# Patient Record
Sex: Male | Born: 1985 | Race: White | Hispanic: No | Marital: Single | Smoking: Never smoker
Health system: Southern US, Community
[De-identification: ages and names within clinical notes are randomized; demographics above are authoritative.]

## PROBLEM LIST (undated history)

## (undated) DIAGNOSIS — I420 Dilated cardiomyopathy: Secondary | ICD-10-CM

## (undated) DIAGNOSIS — I10 Essential (primary) hypertension: Secondary | ICD-10-CM

## (undated) DIAGNOSIS — S83242A Other tear of medial meniscus, current injury, left knee, initial encounter: Secondary | ICD-10-CM

## (undated) DIAGNOSIS — S83519A Sprain of anterior cruciate ligament of unspecified knee, initial encounter: Secondary | ICD-10-CM

## (undated) DIAGNOSIS — I493 Ventricular premature depolarization: Secondary | ICD-10-CM

## (undated) DIAGNOSIS — S83282A Other tear of lateral meniscus, current injury, left knee, initial encounter: Secondary | ICD-10-CM

## (undated) HISTORY — DX: Dilated cardiomyopathy: I42.0

## (undated) HISTORY — DX: Sprain of anterior cruciate ligament of unspecified knee, initial encounter: S83.519A

## (undated) HISTORY — PX: WISDOM TOOTH EXTRACTION: SHX21

## (undated) HISTORY — DX: Ventricular premature depolarization: I49.3

## (undated) HISTORY — PX: OTHER SURGICAL HISTORY: SHX169

---

## 2011-07-19 ENCOUNTER — Encounter: Payer: Self-pay | Admitting: Physician Assistant

## 2011-07-20 ENCOUNTER — Encounter: Payer: Self-pay | Admitting: Physician Assistant

## 2011-07-20 ENCOUNTER — Ambulatory Visit (INDEPENDENT_AMBULATORY_CARE_PROVIDER_SITE_OTHER): Payer: BC Managed Care – PPO | Admitting: Physician Assistant

## 2011-07-20 DIAGNOSIS — I420 Dilated cardiomyopathy: Secondary | ICD-10-CM

## 2011-07-20 DIAGNOSIS — I428 Other cardiomyopathies: Secondary | ICD-10-CM

## 2011-07-20 DIAGNOSIS — I4949 Other premature depolarization: Secondary | ICD-10-CM

## 2011-07-20 DIAGNOSIS — I493 Ventricular premature depolarization: Secondary | ICD-10-CM

## 2011-07-20 MED ORDER — CARVEDILOL 3.125 MG PO TABS: 3.1250 mg | ORAL_TABLET | Freq: Two times a day (BID) | ORAL | Status: DC

## 2011-07-20 MED ORDER — LISINOPRIL 2.5 MG PO TABS: 2.5000 mg | ORAL_TABLET | Freq: Every day | ORAL | Status: DC

## 2011-07-20 NOTE — Progress Notes (Addendum)
History of Present Illness: Primary Electrophysiologist:  Dr. Sherryl Manges (new to Dr. Graciela Husbands today) PCP:  Dr. Royanne Foots  Kevin Simpson is a 25 y.o. male presents for evaluations of PVCs.  He has a h/o "irregular heartbeat" diagnosed at age 49.  He states he went to [facility] for evaluation and describes having an echocardiogram, event monitor and stress test.  He was told these were all normal.  He has not had to undergo regular follow up.  He has played high school basketball and even played for one year in college.  He has never had any problems with sports.  Denies any history of exercise induced syncope.  He now works in Manufacturing systems engineer.  He is active playing basketball on the weekends.  Denies chest pain, dyspnea, orthopnea, PND or edema.  Tore his ACL recently.  Was at surgical center today to have ACL repair.  Developed bradycardia and became near syncopal.  Dr. Yolanda Bonine (anesthesiologist) called Dr. Graciela Husbands, canceled surgery and got him added on to the schedule today.  Of note, saw PA for Dr. Virginia Rochester recently and had an echo due to PVCs.  Echo done at [facility] in [place] 10/3: EF 37%.  Past Medical History  Diagnosis Date  . Torn ACL   . DCM (dilated cardiomyopathy)     EF 37% by Echo 07/19/11   Medications: No current outpatient prescriptions on file.    Allergies: No Known Allergies  History  Substance Use Topics  . Smoking status: Never Smoker   . Smokeless tobacco: Never Used  . Alcohol Use: Yes     3 times per week     Family Hx:  No sudden cardiac death.  No CAD.    ROS:  Please see the history of present illness.  All other systems reviewed and negative.   Vital Signs: BP 118/82  Pulse 102  Ht 6\' 1"  (1.854 m)  Wt 183 lb (83.008 kg)  BMI 24.14 kg/m2  PHYSICAL EXAM: Well nourished, well developed, in no acute distress HEENT: normal Neck: no JVD Vascular: no carotid bruits Endocrine: no thyromegaly Cardiac:  normal S1, S2; irregular rhythm; no murmur Lungs:   clear to auscultation bilaterally, no wheezing, rhonchi or rales Abd: soft, nontender, no hepatomegaly Ext: no edema Skin: warm and dry Neuro:  CNs 2-12 intact, no focal abnormalities noted Psych: normal affect  EKG:  Sinus rhythm, HR 102, normal axis, frequent PVCs with RBBB pattern in V1 and positive axis in lead 3  ASSESSMENT AND PLAN:

## 2011-07-20 NOTE — Assessment & Plan Note (Signed)
For the most part, asymptomatic.  Episode today possibly vagal associated with functional bradycardia due to PVCs.  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  Patient also seen by Dr. Johney Frame.  Discussed further evaluation to include RFCA of PVCs.  Will proceed with Holter Monitor and Cardiac MRI.  Start medicines for CHF.  Risks and benefits of ablation explained to patient.  Dr. Johney Frame will see him back in 2 weeks to re-evaluate and decide on timing of ablation.

## 2011-07-20 NOTE — Patient Instructions (Addendum)
Your physician has requested that you have a cardiac MRI. Cardiac MRI uses a computer to create images of your heart as its beating, producing both still and moving pictures of your heart and major blood vessels. For further information please visit [email or web address]. Please follow the instruction sheet given to you today for more information.  Your physician has recommended that you wear a 24 hour holter monitor. Holter monitors are medical devices that record the heart's electrical activity. Doctors most often use these monitors to diagnose arrhythmias. Arrhythmias are problems with the speed or rhythm of the heartbeat. The monitor is a small, portable device. You can wear one while you do your normal daily activities. This is usually used to diagnose what is causing palpitations/syncope (passing out).  Your physician has recommended you make the following change in your medication:  Start Coreg and Lisinopril  Your physician recommends that you schedule a follow-up appointment in: 2 weeks with Dr. Johney Frame   October 17,2012 at 9:15am  You are scheduled for an ablation on 08/10/11 with  Dr. Johney Frame.  Tresa Endo RN will call you with your appointment time and instructions

## 2011-07-20 NOTE — Assessment & Plan Note (Addendum)
As above.  Start Lisinopril  2.5 mg QD and Coreg 3.25 mg BID.  Follow up as noted.

## 2011-07-24 ENCOUNTER — Encounter (INDEPENDENT_AMBULATORY_CARE_PROVIDER_SITE_OTHER): Payer: BC Managed Care – PPO

## 2011-07-24 DIAGNOSIS — R002 Palpitations: Secondary | ICD-10-CM

## 2011-07-26 ENCOUNTER — Encounter: Payer: Self-pay | Admitting: Internal Medicine

## 2011-08-02 ENCOUNTER — Ambulatory Visit (INDEPENDENT_AMBULATORY_CARE_PROVIDER_SITE_OTHER): Payer: BC Managed Care – PPO | Admitting: Internal Medicine

## 2011-08-02 ENCOUNTER — Encounter: Payer: Self-pay | Admitting: *Deleted

## 2011-08-02 ENCOUNTER — Encounter: Payer: Self-pay | Admitting: Internal Medicine

## 2011-08-02 VITALS — BP 132/83 | HR 61 | Ht 73.0 in | Wt 188.0 lb

## 2011-08-02 DIAGNOSIS — I4949 Other premature depolarization: Secondary | ICD-10-CM

## 2011-08-02 DIAGNOSIS — I420 Dilated cardiomyopathy: Secondary | ICD-10-CM

## 2011-08-02 DIAGNOSIS — I493 Ventricular premature depolarization: Secondary | ICD-10-CM

## 2011-08-02 DIAGNOSIS — I428 Other cardiomyopathies: Secondary | ICD-10-CM

## 2011-08-02 NOTE — Assessment & Plan Note (Signed)
Likely tachycardia mediated Continue coreg and lisinopril We will proceed with ablation and then reassess his EF several months post ablation.

## 2011-08-02 NOTE — Patient Instructions (Signed)
Your physician recommends that you schedule a follow-up appointment in: 4 weeks after ablation which is scheduled for 08/10/11

## 2011-08-02 NOTE — Progress Notes (Signed)
The patient presents today for routine electrophysiology followup.  Since last being seen in our clinic, the patient reports doing very well.  His primary concern is with his knee pain and desire to have surgery for this.  (He recently tore his ACL).    Today, he denies symptoms of palpitations, chest pain, shortness of breath, orthopnea, PND, lower extremity edema, dizziness, presyncope, syncope, or neurologic sequela. His recent holter monitor documented 46,241 PVCs (37%).  The patient feels that he is tolerating medications without difficulties and is otherwise without complaint today.   Past Medical History  Diagnosis Date  . Torn ACL   . DCM (dilated cardiomyopathy)     EF 37% by Echo 07/19/11  . PVC's (premature ventricular contractions)    No past surgical history on file.  Current Outpatient Prescriptions  Medication Sig Dispense Refill  . carvedilol (COREG) 3.125 MG tablet Take 1 tablet (3.125 mg total) by mouth 2 (two) times daily with a meal.  60 tablet  6  . lisinopril (PRINIVIL,ZESTRIL) 2.5 MG tablet Take 1 tablet (2.5 mg total) by mouth daily.  30 tablet  6    No Known Allergies  History   Social History  . Marital Status: Single    Spouse Name: N/A    Number of Children: N/A  . Years of Education: N/A   Occupational History  . furniture delivery    Social History Main Topics  . Smoking status: Never Smoker   . Smokeless tobacco: Never Used  . Alcohol Use: Yes     3 times per week  . Drug Use: Yes     marijuana  . Sexually Active: Not on file   Other Topics Concern  . Not on file   Social History Narrative  . No narrative on file    Physical Exam: Filed Vitals:   08/02/11 0959  BP: 132/83  Pulse: 61  Height: 6\' 1"  (1.854 m)  Weight: 188 lb (85.276 kg)    GEN- The patient is well appearing, alert and oriented x 3 today.   Head- normocephalic, atraumatic Eyes-  Sclera clear, conjunctiva pink Ears- hearing intact Oropharynx- clear Neck- supple, no  JVP Lymph- no cervical lymphadenopathy Lungs- Clear to ausculation bilaterally, normal work of breathing Heart- Regular rate and rhythm with frequently ectopy, no murmurs, rubs or gallops, PMI not laterally displaced GI- soft, NT, ND, + BS Extremities- no clubbing, cyanosis, or edema MS-walks slowly due to torn acl Skin- no rash or lesion Psych- euthymic mood, full affect Neuro- strength and sensation are intact  EKG 07/20/11 reveals sinus with PVCs of a RBB/R inferior axis morphology  Assessment and Plan:

## 2011-08-02 NOTE — Assessment & Plan Note (Signed)
The patient has a very high burden of PVCs.  Though he is not symptomatic, I feel that his dilated cardiomyopathy is tachycardia mediated and due to his PVCs.  I have therefore recommended catheter ablation.  Therapeutic strategies for PVCs including medicine and ablation were discussed in detail with the patient today. Risk, benefits, and alternatives to EP study and radiofrequency ablation  were also discussed in detail today. These risks include but are not limited to stroke, bleeding, vascular damage, tamponade, perforation, damage to the heart and other structures,AV block requiring a pacemaker, worsening renal function, and death. The patient understands these risk and wishes to proceed.  We will therefore proceed with catheter ablation at the next available time.  We will obtain a cardiac MRI in there interim to evaluate for LV scar.

## 2011-08-03 ENCOUNTER — Encounter: Payer: Self-pay | Admitting: *Deleted

## 2011-08-04 ENCOUNTER — Telehealth: Payer: Self-pay | Admitting: *Deleted

## 2011-08-04 ENCOUNTER — Ambulatory Visit (HOSPITAL_COMMUNITY)
Admission: RE | Admit: 2011-08-04 | Discharge: 2011-08-04 | Disposition: A | Discharge status: Home / Self Care | Payer: BC Managed Care – PPO | Source: Ambulatory Visit | Attending: Physician Assistant | Admitting: Physician Assistant

## 2011-08-04 DIAGNOSIS — I4949 Other premature depolarization: Secondary | ICD-10-CM

## 2011-08-04 DIAGNOSIS — I428 Other cardiomyopathies: Secondary | ICD-10-CM | POA: Insufficient documentation

## 2011-08-04 DIAGNOSIS — I493 Ventricular premature depolarization: Secondary | ICD-10-CM

## 2011-08-04 DIAGNOSIS — I420 Dilated cardiomyopathy: Secondary | ICD-10-CM

## 2011-08-04 LAB — BASIC METABOLIC PANEL
BUN: 15 mg/dL (ref 6–23)
Creatinine, Ser: 1.15 mg/dL (ref 0.50–1.35)
GFR calc Af Amer: >90 mL/min (ref >90)
GFR calc non Af Amer: 89 mL/min — ABNORMAL LOW (ref >90)
Glucose, Bld: 97 mg/dL (ref 70–99)

## 2011-08-04 MED ORDER — GADOBENATE DIMEGLUMINE 529 MG/ML IV SOLN
25.0000 mL | Freq: Once | INTRAVENOUS | Status: AC
  Administered 2011-08-04: 25 mL via INTRAVENOUS

## 2011-08-04 NOTE — Telephone Encounter (Signed)
Pr aware of lab results today and will have repeat labs 08/07/11. Lab order placed today bmet, pth. When giving pt his lab results he states that he did have a big glass of orange juice and a bagel w/cream cheese. I told him that I would let the PA know this since it may have a reflection on his lab results from today. Told pt that I will call him back if he does not need to repeat labs on 08/07/11 (monday). Pt gave verbal understanding today. Danielle Rankin

## 2011-08-07 ENCOUNTER — Other Ambulatory Visit (INDEPENDENT_AMBULATORY_CARE_PROVIDER_SITE_OTHER): Payer: BC Managed Care – PPO | Admitting: *Deleted

## 2011-08-07 DIAGNOSIS — I4949 Other premature depolarization: Secondary | ICD-10-CM

## 2011-08-07 DIAGNOSIS — I428 Other cardiomyopathies: Secondary | ICD-10-CM

## 2011-08-07 LAB — BASIC METABOLIC PANEL
BUN: 14 mg/dL (ref 6–23)
Calcium: 9.9 mg/dL (ref 8.4–10.5)
Creatinine, Ser: 1.3 mg/dL (ref 0.4–1.5)

## 2011-08-08 ENCOUNTER — Other Ambulatory Visit: Payer: Self-pay | Admitting: Physician Assistant

## 2011-08-08 LAB — CBC
HCT: 48.1 % (ref 39.0–52.0)
Hemoglobin: 16.1 g/dL (ref 13.0–17.0)
RBC: 5.33 MIL/uL (ref 4.22–5.81)
WBC: 4.4 10*3/uL (ref 4.0–10.5)

## 2011-08-08 LAB — PTH, INTACT AND CALCIUM

## 2011-08-10 ENCOUNTER — Ambulatory Visit (HOSPITAL_COMMUNITY)
Admission: RE | Admit: 2011-08-10 | Discharge: 2011-08-11 | Disposition: A | Discharge status: Home / Self Care | Payer: BC Managed Care – PPO | Source: Ambulatory Visit | Attending: Internal Medicine | Admitting: Internal Medicine

## 2011-08-10 DIAGNOSIS — I4729 Other ventricular tachycardia: Secondary | ICD-10-CM | POA: Insufficient documentation

## 2011-08-10 DIAGNOSIS — I472 Ventricular tachycardia, unspecified: Secondary | ICD-10-CM | POA: Insufficient documentation

## 2011-08-10 DIAGNOSIS — I4949 Other premature depolarization: Secondary | ICD-10-CM | POA: Insufficient documentation

## 2011-08-10 LAB — POCT ACTIVATED CLOTTING TIME
Activated Clotting Time: 204 seconds
Activated Clotting Time: 210 seconds
Activated Clotting Time: 259 seconds

## 2011-08-10 LAB — PTH, INTACT AND CALCIUM: PTH: 16.9 pg/mL (ref 14.0–72.0)

## 2011-08-11 ENCOUNTER — Telehealth: Payer: Self-pay | Admitting: *Deleted

## 2011-08-11 NOTE — Telephone Encounter (Signed)
lmom labs normal. Danielle Rankin

## 2011-08-15 ENCOUNTER — Encounter: Payer: Self-pay | Admitting: Physician Assistant

## 2011-08-17 NOTE — Op Note (Signed)
NAMEMarland Kitchen  Kevin Simpson, Kevin Simpson NO.:  [id number]  MEDICAL RECORD NO.:  [id number]  LOCATION:  2504                         FACILITY:  [facility]  PHYSICIAN:  [name], MD       DATE OF BIRTH:  [birth year]  DATE OF PROCEDURE: DATE OF DISCHARGE:                              OPERATIVE REPORT   SURGEON:  [name], MD  PREPROCEDURE DIAGNOSIS:  Premature ventricular contractions and nonsustained ventricular tachycardia.  POSTPROCEDURE DIAGNOSIS:  Nonsustained ventricular tachycardia and premature ventricular contractions arising from the anterolateral papillary muscle of the left ventricle.  PROCEDURES: 1. Comprehensive electrophysiologic study. 2. Coronary sinus pacing and recording. 3. Three-D mapping of ventricular tachycardia. 4. Ablation of ventricular tachycardia. 5. Arterial blood pressure monitoring. 6. Left ventricular pacing and recording.  INTRODUCTION:  Kevin Simpson is a pleasant 25 year old gentleman who presents for EP study and radiofrequency ablation.  He recently has been found to have very frequent PVCs and nonsustained ventricular tachycardia.  He has a cardiomyopathy which is felt to likely be PVC mediated with a recent ejection fraction of 37%.  He therefore presents today for EP study and radiofrequency ablation.  DESCRIPTION OF PROCEDURE:  Informed written consent was obtained, and the patient was brought to the electrophysiology lab in the fasting state.  He was adequately sedated with intravenous medications as outlined in the anesthesia report.  The patient's right groin was prepped and draped in the usual sterile fashion by the EP lab staff. Using a percutaneous Seldinger technique, two 7-French and one 8-French hemostasis sheaths were placed in the right common femoral vein.  An 8- [place] hemostasis sheath was placed in the right common femoral artery for blood pressure monitoring.  A 7-French Biosense Webster decapolar coronary sinus  catheter was introduced through the right common femoral vein and advanced into the coronary sinus for recording and pacing from this location.  A 6-French quadripolar Josephson catheter was introduced through the right common femoral vein and advanced into the right ventricle for recording and pacing.  This catheter was then pulled back to the His bundle location.  The patient presented to the electrophysiology lab in sinus rhythm with PVCs in a bigeminal pattern. The PVC morphology was of a right bundle-branch right inferior axis with a QRS duration of 156 msec.  The PR interval measured 139 msec with a QRS duration on sinus beats of 110 msec and a QT interval of 280 msec. The AH interval measured 104 msec with an HV interval of 45 msec. Atrial or ventricular pacing could not be easily performed due to incessant tachycardia.  I therefore elected to perform mapping and ablation of the PVC focus.  A 3.5-mm Biosense Webster EZ Steer ThermoCool ablation catheter was introduced into the right common femoral artery and advanced into the left ventricle using a retrograde aortic approach.  Heparin was administered for adequate anticoagulation. Three-dimensional electroanatomical mapping was performed using the CARTO mapping system.  This demonstrated that the earliest activation arose over the mid anterolateral portion of the left ventricle.  This was felt to be in close approximation to the left anterolateral papillary muscle.  At the site of earliest activation, the local activation preceded the surface QRS  by 40 msec.  Pace mapping was performed which revealed a 12/12 pace map match.  A series of radiofrequency applications were delivered in this location at 40 watts with a target temperature of 40 degrees.  Applications were delivered between 60 and 120 seconds in duration.  A total of 19 radiofrequency applications were delivered.  During ablation, initially, the PVCs accelerated into  sustained ventricular tachycardia with the same surface QRS morphology and then terminated.  Prolonged periods of sinus rhythm were observed, however, eventually the PVCs would return.  In this fashion extensive radiofrequency ablation was delivered in this local area.  Additional mapping again confirmed that this was earliest activation site.  During sinus rhythm, ventricular pacing was performed, which revealed VA dissociation.  Atrial pacing was performed, which revealed PR less than RR with an AV Wenckebach cycle length of 400 msec. The patient was observed to continue to have intermittent PVCs.  It was felt that this was likely a part of the mitral valve apparatus over the anterolateral papillary muscle of the left ventricle, which was a very thick tissue.  The patient continued to have frequent but significantly reduced premature ventricular contractions at the end of the case after an extensive ablation.  It was felt most prudent to discontinue the procedure.  The procedure was therefore considered completed.  All catheters were removed and the sheaths were aspirated and flushed.  The sheaths were removed and hemostasis was assured.  There were no early apparent complications.  CONCLUSIONS: 1. Sinus rhythm with frequent premature ventricular contractions upon     presentation.  These PVCs were of a right bundle-branch right     inferior axis and were felt to arise from the anterolateral     papillary muscle apparatus. 2. Significant radiofrequency current was applied with significant     reduction in PVC frequency, however, the PVC was not fully     terminated despite extensive ablation. 3. No early apparent complications.     [name], MD     JA/MEDQ  D:  08/10/2011  T:  08/11/2011  Job:  [id number]  cc:   Duke Salvia, MD, [facility] Royanne Foots, MD  Electronically Signed by [name] MD on 08/17/2011 02:35:31 PM

## 2011-08-21 ENCOUNTER — Ambulatory Visit: Payer: BC Managed Care – PPO | Admitting: Internal Medicine

## 2011-09-06 ENCOUNTER — Encounter: Payer: Self-pay | Admitting: Internal Medicine

## 2011-09-06 ENCOUNTER — Ambulatory Visit (INDEPENDENT_AMBULATORY_CARE_PROVIDER_SITE_OTHER): Payer: BC Managed Care – PPO | Admitting: Internal Medicine

## 2011-09-06 VITALS — BP 145/61 | HR 41 | Ht 73.0 in | Wt 193.0 lb

## 2011-09-06 DIAGNOSIS — I4949 Other premature depolarization: Secondary | ICD-10-CM

## 2011-09-06 DIAGNOSIS — I493 Ventricular premature depolarization: Secondary | ICD-10-CM

## 2011-09-06 DIAGNOSIS — I428 Other cardiomyopathies: Secondary | ICD-10-CM

## 2011-09-06 DIAGNOSIS — I420 Dilated cardiomyopathy: Secondary | ICD-10-CM

## 2011-09-06 MED ORDER — LISINOPRIL 5 MG PO TABS: 5.0000 mg | ORAL_TABLET | Freq: Every day | ORAL | Status: DC

## 2011-09-06 MED ORDER — CARVEDILOL 6.25 MG PO TABS: 6.2500 mg | ORAL_TABLET | Freq: Two times a day (BID) | ORAL | Status: DC

## 2011-09-06 NOTE — Assessment & Plan Note (Addendum)
Unfortunately, his PVCs persist s/p ablation.  At this time, I think that our option is to consider an antiarrhythmic drug verses consideration of ablation at a tertiary care center.  Given the complexity of this patient's case, I would favor referral to Dr Humberto Leep at [facility].    I have spoke with Dr Staci Righter who is willing to see the patient in consultation with plans for subsequent repeat ablation.  Will forward my notes to his office and await his reply. For now continue current medical therapy.  Pt OK to proceed with knee surgery in the interim.

## 2011-09-06 NOTE — Assessment & Plan Note (Signed)
Increase lisinopril to 5mg  daily Continue coreg His cardiomyopathy is likely due to PVCs and should hopefully resolve if we can manage his PVCs.

## 2011-09-06 NOTE — Progress Notes (Signed)
Kevin Maker, MD PCP  The patient presents today for electrophysiology followup.  Since his recent ablation, the patient reports doing very well.  He denies procedure related complications.  He remains minimally symptomatic with his PVCs.  He reports mild dizziness and fatigue with coreg.  Today, he denies symptoms of palpitations, chest pain, shortness of breath, orthopnea, PND, lower extremity edema,  presyncope, syncope, or neurologic sequela.  The patient feels that he is tolerating medications without difficulties and is otherwise without complaint today.   Past Medical History  Diagnosis Date  . Torn ACL   . DCM (dilated cardiomyopathy)     EF 37% by Echo 07/19/11  . PVC's (premature ventricular contractions)     s/p EPS and ablation 08/10/11 with PVC unsuccesfully ablated   Past Surgical History  Procedure Date  . Ep study and radiofrequency ablation 08/10/11    PVC ablation,  The PVCs were of a right bundle-branch right inferior axis and were felt to arise from the anterolateral  papillary muscle but could not be successfully ablated    Current Outpatient Prescriptions  Medication Sig Dispense Refill  . carvedilol (COREG) 6.25 MG tablet Take 1 tablet (6.25 mg total) by mouth 2 (two) times daily with a meal.  60 tablet  6  . lisinopril (PRINIVIL,ZESTRIL) 5 MG tablet Take 1 tablet (5 mg total) by mouth daily.  30 tablet  6  . DISCONTD: carvedilol (COREG) 3.125 MG tablet Take 1 tablet (3.125 mg total) by mouth 2 (two) times daily with a meal.  60 tablet  6  . DISCONTD: lisinopril (PRINIVIL,ZESTRIL) 2.5 MG tablet Take 1 tablet (2.5 mg total) by mouth daily.  30 tablet  6    No Known Allergies  History   Social History  . Marital Status: Single    Spouse Name: N/A    Number of Children: N/A  . Years of Education: N/A   Occupational History  . furniture delivery    Social History Main Topics  . Smoking status: Never Smoker   . Smokeless tobacco: Never Used  . Alcohol Use:  Yes     3 times per week  . Drug Use: Yes     marijuana  . Sexually Active: Not on file   Other Topics Concern  . Not on file   Social History Narrative  . No narrative on file   Physical Exam: Filed Vitals:   09/06/11 1039  BP: 145/61  Pulse: 41  Height: 6\' 1"  (1.854 m)  Weight: 193 lb (87.544 kg)    GEN- The patient is well appearing, alert and oriented x 3 today.   Head- normocephalic, atraumatic Eyes-  Sclera clear, conjunctiva pink Ears- hearing intact Oropharynx- clear Neck- supple, no JVP Lymph- no cervical lymphadenopathy Lungs- Clear to ausculation bilaterally, normal work of breathing Heart- Regularly irregular rhythm with bigeminal ectopy, no murmurs, rubs or gallops, PMI not laterally displaced GI- soft, NT, ND, + BS Extremities- no clubbing, cyanosis, or edema MS- no significant deformity or atrophy Skin- no rash or lesion Psych- euthymic mood, full affect Neuro- strength and sensation are intact  ekg today reveals sinus rhythm with PVCs in bigeminy,  PVCs are RBB R inferior axis  Assessment and Plan:

## 2011-09-06 NOTE — Patient Instructions (Addendum)
Your physician recommends that you schedule a follow-up appointment in: 6 weeks with Dr Johney Frame  Dr Johney Frame will be in touch with Dr Maryann Conners regarding your case and we will contact you next week  Your physician has recommended you make the following change in your medication:  1)Increase Lisinopril to 5mg  daily

## 2011-09-11 ENCOUNTER — Telehealth: Payer: Self-pay | Admitting: Internal Medicine

## 2011-09-11 NOTE — Telephone Encounter (Signed)
LOVx2 ([facility]) 12 lead,Holer Monitor, Cardiac MRI,EP Procedure all faxed to Dr.Wilber's Office/[place]  @ [phone]  09/11/11/km

## 2011-09-13 ENCOUNTER — Encounter: Payer: Self-pay | Admitting: Physician Assistant

## 2011-09-15 ENCOUNTER — Telehealth: Payer: Self-pay | Admitting: Internal Medicine

## 2011-09-15 NOTE — Telephone Encounter (Signed)
Kevin Simpson is needing an acl repair and they are requesting a clearance be faxed.  Dr Cynda Familia is doing the surgery.  The date will not be set until the clearance is faxed.  They would like to do the surgery in Dec. If possible.

## 2011-09-15 NOTE — Telephone Encounter (Signed)
Pt having a knee surgery, needs clearance letter to [place] doing surgery, fax to [facility] dr landau [phone] also was to check with a specialist in [place] about abaltion

## 2011-09-19 ENCOUNTER — Telehealth: Payer: Self-pay | Admitting: Internal Medicine

## 2011-09-19 NOTE — Telephone Encounter (Signed)
Last dictation sent for clearance

## 2011-09-19 NOTE — Telephone Encounter (Signed)
New problem Pt wants to know about surgical clearance letter to Dr Dion Saucier please call him back

## 2011-09-19 NOTE — Telephone Encounter (Signed)
Clearance faxed   Father aware

## 2011-09-21 ENCOUNTER — Telehealth: Payer: Self-pay | Admitting: Internal Medicine

## 2011-09-21 NOTE — Telephone Encounter (Signed)
New problem:  Status of clearance letter to [facility]/ [facility].

## 2011-09-21 NOTE — Telephone Encounter (Signed)
Spoke with patients father  This was faxed on 09/19/11  It is part of Dr Jenel Lucks dictation from his 09/06/11 office note  I will call there office for him and make them aware of where to look

## 2011-10-23 ENCOUNTER — Ambulatory Visit (INDEPENDENT_AMBULATORY_CARE_PROVIDER_SITE_OTHER): Payer: BC Managed Care – PPO | Admitting: Internal Medicine

## 2011-10-23 ENCOUNTER — Other Ambulatory Visit: Payer: Self-pay | Admitting: Orthopedic Surgery

## 2011-10-23 ENCOUNTER — Encounter: Payer: Self-pay | Admitting: Internal Medicine

## 2011-10-23 DIAGNOSIS — I4949 Other premature depolarization: Secondary | ICD-10-CM

## 2011-10-23 DIAGNOSIS — I428 Other cardiomyopathies: Secondary | ICD-10-CM

## 2011-10-23 DIAGNOSIS — I420 Dilated cardiomyopathy: Secondary | ICD-10-CM

## 2011-10-23 DIAGNOSIS — I493 Ventricular premature depolarization: Secondary | ICD-10-CM

## 2011-10-23 MED ORDER — LISINOPRIL 10 MG PO TABS: 10.0000 mg | ORAL_TABLET | Freq: Every day | ORAL | Status: DC

## 2011-10-23 NOTE — Progress Notes (Signed)
Dan Maker, MD PCP  The patient presents today for electrophysiology followup.  Since his last visit, the patient reports doing very well.  He remains minimally symptomatic with his PVCs.  He is having knee surgery next week and will then plan to see Stephenie Acres MD in 2-3 months for repeat ablation.  Today, he denies symptoms of palpitations, chest pain, shortness of breath, orthopnea, PND, lower extremity edema,  presyncope, syncope, or neurologic sequela.  The patient feels that he is tolerating medications without difficulties and is otherwise without complaint today.   Past Medical History  Diagnosis Date  . Torn ACL   . DCM (dilated cardiomyopathy)     EF 37% by Echo 07/19/11  . PVC's (premature ventricular contractions)     s/p EPS and ablation 08/10/11 with PVC unsuccesfully ablated   Past Surgical History  Procedure Date  . Ep study and radiofrequency ablation 08/10/11    PVC ablation,  The PVCs were of a right bundle-branch right inferior axis and were felt to arise from the anterolateral  papillary muscle but could not be successfully ablated    Current Outpatient Prescriptions  Medication Sig Dispense Refill  . ASPIRIN CR PO Take 1 tablet by mouth daily.        . carvedilol (COREG) 6.25 MG tablet Take 1 tablet (6.25 mg total) by mouth 2 (two) times daily with a meal.  60 tablet  6  . lisinopril (PRINIVIL,ZESTRIL) 5 MG tablet Take 1 tablet (5 mg total) by mouth daily.  30 tablet  6    No Known Allergies  History   Social History  . Marital Status: Single    Spouse Name: N/A    Number of Children: N/A  . Years of Education: N/A   Occupational History  . furniture delivery    Social History Main Topics  . Smoking status: Never Smoker   . Smokeless tobacco: Never Used  . Alcohol Use: Yes     3 times per week  . Drug Use: Yes     marijuana  . Sexually Active: Not on file   Other Topics Concern  . Not on file   Social History Narrative  . No narrative on  file   Physical Exam: Filed Vitals:   10/23/11 0931  BP: 130/80  Pulse: 36  Height: 6\' 1"  (1.854 m)  Weight: 194 lb (87.998 kg)    GEN- The patient is well appearing, alert and oriented x 3 today.   Head- normocephalic, atraumatic Eyes-  Sclera clear, conjunctiva pink Ears- hearing intact Oropharynx- clear Neck- supple, no JVP Lymph- no cervical lymphadenopathy Lungs- Clear to ausculation bilaterally, normal work of breathing Heart- Regularly irregular rhythm with bigeminal ectopy, no murmurs, rubs or gallops, PMI not laterally displaced GI- soft, NT, ND, + BS Extremities- no clubbing, cyanosis, or edema MS- no significant deformity or atrophy Skin- no rash or lesion Psych- euthymic mood, full affect Neuro- strength and sensation are intact  ekg last visit revealed sinus rhythm with PVCs in bigeminy,  PVCs are RBB R inferior axis  Assessment and Plan:

## 2011-10-23 NOTE — Patient Instructions (Signed)
Your physician recommends that you schedule a follow-up appointment in: 3 months with Dr Johney Frame    Your physician has recommended you make the following change in your medication:  1) Increase Lisinopril to 10mg  daily

## 2011-10-23 NOTE — Assessment & Plan Note (Signed)
No changes today Will see Dr Lenna Gilford [facility]) in 2-3 months to consider catheter ablation of difficult to treat PVCs

## 2011-10-23 NOTE — Assessment & Plan Note (Signed)
Increase lisinopril to 10mg  daily today

## 2011-10-24 ENCOUNTER — Encounter (HOSPITAL_COMMUNITY): Payer: Self-pay | Admitting: Pharmacy Technician

## 2011-10-24 ENCOUNTER — Ambulatory Visit (HOSPITAL_COMMUNITY)
Admission: RE | Admit: 2011-10-24 | Discharge: 2011-10-24 | Disposition: A | Discharge status: Home / Self Care | Payer: BC Managed Care – PPO | Source: Ambulatory Visit | Attending: Anesthesiology | Admitting: Anesthesiology

## 2011-10-24 ENCOUNTER — Other Ambulatory Visit (HOSPITAL_COMMUNITY): Payer: Self-pay | Admitting: *Deleted

## 2011-10-24 ENCOUNTER — Encounter (HOSPITAL_COMMUNITY)
Admission: RE | Admit: 2011-10-24 | Discharge: 2011-10-24 | Disposition: A | Discharge status: Home / Self Care | Payer: BC Managed Care – PPO | Source: Ambulatory Visit | Attending: Orthopedic Surgery | Admitting: Orthopedic Surgery

## 2011-10-24 ENCOUNTER — Encounter (HOSPITAL_COMMUNITY): Payer: Self-pay

## 2011-10-24 DIAGNOSIS — Z01812 Encounter for preprocedural laboratory examination: Secondary | ICD-10-CM | POA: Insufficient documentation

## 2011-10-24 DIAGNOSIS — Z01818 Encounter for other preprocedural examination: Secondary | ICD-10-CM | POA: Insufficient documentation

## 2011-10-24 HISTORY — DX: Essential (primary) hypertension: I10

## 2011-10-24 LAB — BASIC METABOLIC PANEL
BUN: 14 mg/dL (ref 6–23)
CO2: 27 mEq/L (ref 19–32)
Chloride: 101 mEq/L (ref 96–112)
Creatinine, Ser: 1.36 mg/dL — ABNORMAL HIGH (ref 0.50–1.35)
GFR calc Af Amer: 83 mL/min — ABNORMAL LOW (ref >90)
Potassium: 4.3 mEq/L (ref 3.5–5.1)

## 2011-10-24 LAB — CBC
HCT: 47 % (ref 39.0–52.0)
Hemoglobin: 16.5 g/dL (ref 13.0–17.0)
MCH: 31.2 pg (ref 26.0–34.0)
MCHC: 35.1 g/dL (ref 30.0–36.0)
RBC: 5.29 MIL/uL (ref 4.22–5.81)

## 2011-10-24 LAB — SURGICAL PCR SCREEN: Staphylococcus aureus: POSITIVE — AB

## 2011-10-24 NOTE — Pre-Procedure Instructions (Signed)
21 Kevin Simpson  10/24/2011   Your procedure is scheduled on 10/30/2011 @ 1215  Report to [facility] at  10:15 AM.  Call this number if you have problems the morning of surgery: [phone]    Remember:   Do not eat food:After Midnight.  May have clear liquids: up to 4 Hours before arrival.  Clear liquids include soda, tea, black coffee, apple or grape juice, broth.until 6:15  AM  Take these medicines the morning of surgery with A SIP OF WATER Coreg  Do not wear jewelry, make-up or nail polish.  Do not wear lotions, powders, or perfumes. You may wear deodorant.  Do not shave 48 hours prior to surgery.  Do not bring valuables to the hospital.  Contacts, dentures or bridgework may not be worn into surgery.  Leave suitcase in the car. After surgery it may be brought to your room.  For patients admitted to the hospital, checkout time is 11:00 AM the day of discharge.   Patients discharged the day of surgery will not be allowed to drive home.  Name and phone number of your driver:  Special Instructions: Incentive Spirometry - Practice and bring it with you on the day of surgery. and CHG Shower Use Special Wash: 1/2 bottle night before surgery and 1/2 bottle morning of surgery.   Please read over the following fact sheets that you were given: MRSA Information and Surgical Site Infection Prevention

## 2011-10-24 NOTE — Consult Note (Signed)
Anesthesia:  Patient is a 26 year old male scheduled for an ACL reconstruction on 10/30/11.  His other history includes dilated CM with EF 37%, PVCs s/p unsuccessful ablation on 08/10/11, HTN.  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  His EP Cardiologist is Dr. Johney Simpson.  He was cleared for this procedure (see 09/07/11 Note).  He was actually just seen again by Dr. Johney Simpson on 10/23/11 and no changes were made other than increasing his Lisinopril.   He is also on Coreg and ASA.  Kevin Simpson is also planning for catheter ablation by Dr. Stephenie Acres [facility]) post-operatively in 2-3 months.  His last EKG was on 09/06/11 and showed ST at 106 bpm, bigeminy PVCs, cannot rule out inferior infarct, and lateral ST abnormality.  He has known frequent PVCs and is yet to have a repeat ablation, so I will not plan on repeating his EKG pre-operatively.  Echo done at [place] [facility] on 07/19/11 showed a dilated LV with diffuse hypokinesis, moderately depressed EF, EF 37%, PVCs, and no significant valve disease, unable to estimate pulmonary artery pressure.  CXR today showed no acute cardiopulmonary process.  Labs are acceptable.  His Cr is mildly elevated at 1.36.    Plan to proceed.  His assigned Anesthesiologist will evaluate him on the day of surgery.  Can consider placing defibrillator pads perioperatively due to his history of dilated CM, EF 37%, and frequent PVCs.

## 2011-10-24 NOTE — Anesthesia Preprocedure Evaluation (Addendum)
Anesthesia Evaluation  Patient identified by MRN, date of birth, ID band Patient awake    Reviewed: Allergy & Precautions, H&P , NPO status , Patient's Chart, lab work & pertinent test results, reviewed documented beta blocker date and time   Airway Mallampati: I TM Distance: >3 FB Neck ROM: Full    Dental  (+) Teeth Intact   Pulmonary          Cardiovascular hypertension, Pt. on home beta blockers  Dilated CM, EF 37%, frequent/bigeminy PVCs s/p failed ablation (Dr. Johney Frame)   Neuro/Psych    GI/Hepatic   Endo/Other    Renal/GU      Musculoskeletal   Abdominal   Peds  Hematology   Anesthesia Other Findings   Reproductive/Obstetrics                         Anesthesia Physical Anesthesia Plan  ASA: III  Anesthesia Plan: General   Post-op Pain Management: MAC Combined w/ Regional for Post-op pain   Induction: Intravenous  Airway Management Planned: LMA  Additional Equipment:   Intra-op Plan:   Post-operative Plan: Extubation in OR  Informed Consent: I have reviewed the patients History and Physical, chart, labs and discussed the procedure including the risks, benefits and alternatives for the proposed anesthesia with the patient or authorized representative who has indicated his/her understanding and acceptance.     Plan Discussed with: CRNA and Surgeon  Anesthesia Plan Comments:         Anesthesia Quick Evaluation

## 2011-10-27 NOTE — Progress Notes (Signed)
Spoke with patient's dad and instructed him to arrive at 1130 on Monday due to surgery time change.

## 2011-10-29 MED ORDER — CEFAZOLIN SODIUM-DEXTROSE 2-3 GM-% IV SOLR
2.0000 g | INTRAVENOUS | Status: AC
  Administered 2011-10-30: 2 g via INTRAVENOUS
  Filled 2011-10-29: qty 50

## 2011-10-30 ENCOUNTER — Encounter (HOSPITAL_COMMUNITY): Payer: Self-pay | Admitting: Vascular Surgery

## 2011-10-30 ENCOUNTER — Encounter (HOSPITAL_COMMUNITY): Payer: Self-pay | Admitting: Orthopedic Surgery

## 2011-10-30 ENCOUNTER — Ambulatory Visit (HOSPITAL_COMMUNITY)
Admission: RE | Admit: 2011-10-30 | Discharge: 2011-10-31 | Disposition: A | Discharge status: Home / Self Care | Payer: BC Managed Care – PPO | Source: Ambulatory Visit | Attending: Orthopedic Surgery | Admitting: Orthopedic Surgery

## 2011-10-30 ENCOUNTER — Encounter (HOSPITAL_COMMUNITY): Payer: Self-pay | Admitting: *Deleted

## 2011-10-30 ENCOUNTER — Encounter (HOSPITAL_COMMUNITY)
Admission: RE | Disposition: A | Discharge status: Home / Self Care | Payer: Self-pay | Source: Ambulatory Visit | Attending: Orthopedic Surgery

## 2011-10-30 ENCOUNTER — Ambulatory Visit (HOSPITAL_COMMUNITY): Payer: BC Managed Care – PPO | Admitting: Vascular Surgery

## 2011-10-30 DIAGNOSIS — IMO0002 Reserved for concepts with insufficient information to code with codable children: Secondary | ICD-10-CM | POA: Insufficient documentation

## 2011-10-30 DIAGNOSIS — S83242A Other tear of medial meniscus, current injury, left knee, initial encounter: Secondary | ICD-10-CM | POA: Diagnosis present

## 2011-10-30 DIAGNOSIS — S83509A Sprain of unspecified cruciate ligament of unspecified knee, initial encounter: Secondary | ICD-10-CM | POA: Insufficient documentation

## 2011-10-30 DIAGNOSIS — S83289A Other tear of lateral meniscus, current injury, unspecified knee, initial encounter: Secondary | ICD-10-CM | POA: Insufficient documentation

## 2011-10-30 DIAGNOSIS — S83519A Sprain of anterior cruciate ligament of unspecified knee, initial encounter: Secondary | ICD-10-CM | POA: Diagnosis present

## 2011-10-30 DIAGNOSIS — I493 Ventricular premature depolarization: Secondary | ICD-10-CM

## 2011-10-30 DIAGNOSIS — X58XXXA Exposure to other specified factors, initial encounter: Secondary | ICD-10-CM | POA: Insufficient documentation

## 2011-10-30 DIAGNOSIS — I420 Dilated cardiomyopathy: Secondary | ICD-10-CM

## 2011-10-30 DIAGNOSIS — S83282A Other tear of lateral meniscus, current injury, left knee, initial encounter: Secondary | ICD-10-CM | POA: Diagnosis present

## 2011-10-30 HISTORY — DX: Other tear of lateral meniscus, current injury, left knee, initial encounter: S83.282A

## 2011-10-30 HISTORY — PX: ANTERIOR CRUCIATE LIGAMENT REPAIR: SHX115

## 2011-10-30 HISTORY — DX: Sprain of anterior cruciate ligament of unspecified knee, initial encounter: S83.519A

## 2011-10-30 HISTORY — DX: Other tear of medial meniscus, current injury, left knee, initial encounter: S83.242A

## 2011-10-30 HISTORY — PX: MENISECTOMY: SHX5181

## 2011-10-30 SURGERY — RECONSTRUCTION, KNEE, ACL
Anesthesia: General | Site: Knee | Laterality: Left | Wound class: Clean

## 2011-10-30 MED ORDER — POTASSIUM CHLORIDE IN NACL 20-0.45 MEQ/L-% IV SOLN
INTRAVENOUS | Status: DC
  Administered 2011-10-30 – 2011-10-31 (×2): via INTRAVENOUS
  Filled 2011-10-30 (×4): qty 1000

## 2011-10-30 MED ORDER — METHOCARBAMOL 500 MG PO TABS: 500.0000 mg | ORAL_TABLET | Freq: Four times a day (QID) | ORAL | Status: AC

## 2011-10-30 MED ORDER — OXYCODONE HCL 5 MG PO TABS
5.0000 mg | ORAL_TABLET | ORAL | Status: DC | PRN
  Administered 2011-10-31: 5 mg via ORAL
  Administered 2011-10-31: 10 mg via ORAL
  Filled 2011-10-30: qty 2
  Filled 2011-10-30: qty 1

## 2011-10-30 MED ORDER — LISINOPRIL 10 MG PO TABS
10.0000 mg | ORAL_TABLET | Freq: Every day | ORAL | Status: DC
  Administered 2011-10-31: 10 mg via ORAL
  Filled 2011-10-30: qty 1

## 2011-10-30 MED ORDER — ONDANSETRON HCL 4 MG/2ML IJ SOLN
INTRAMUSCULAR | Status: DC | PRN
  Administered 2011-10-30: 4 mg via INTRAVENOUS

## 2011-10-30 MED ORDER — SODIUM CHLORIDE 0.9 % IR SOLN
Status: DC | PRN
  Administered 2011-10-30 (×2): 3000 mL
  Administered 2011-10-30: 6000 mL

## 2011-10-30 MED ORDER — FENTANYL CITRATE 0.05 MG/ML IJ SOLN: 50.0000 ug | INTRAMUSCULAR | Status: DC | PRN

## 2011-10-30 MED ORDER — MIDAZOLAM HCL 5 MG/5ML IJ SOLN
INTRAMUSCULAR | Status: DC | PRN
  Administered 2011-10-30 (×2): 1 mg via INTRAVENOUS

## 2011-10-30 MED ORDER — LACTATED RINGERS IV SOLN
INTRAVENOUS | Status: DC | PRN
  Administered 2011-10-30 (×2): via INTRAVENOUS

## 2011-10-30 MED ORDER — ZOLPIDEM TARTRATE 5 MG PO TABS: 5.0000 mg | ORAL_TABLET | Freq: Every evening | ORAL | Status: DC | PRN

## 2011-10-30 MED ORDER — LACTATED RINGERS IV SOLN
INTRAVENOUS | Status: DC
  Administered 2011-10-30: 13:00:00 via INTRAVENOUS

## 2011-10-30 MED ORDER — PROPOFOL 10 MG/ML IV EMUL
INTRAVENOUS | Status: DC | PRN
  Administered 2011-10-30: 150 mg via INTRAVENOUS

## 2011-10-30 MED ORDER — BUPIVACAINE-EPINEPHRINE PF 0.5-1:200000 % IJ SOLN
INTRAMUSCULAR | Status: DC | PRN
  Administered 2011-10-30: 30 mL

## 2011-10-30 MED ORDER — DIPHENHYDRAMINE HCL 12.5 MG/5ML PO ELIX
12.5000 mg | ORAL_SOLUTION | ORAL | Status: DC | PRN
Start: 2011-10-30 — End: 2011-10-31
  Filled 2011-10-30: qty 10

## 2011-10-30 MED ORDER — HYDROMORPHONE HCL PF 1 MG/ML IJ SOLN
0.5000 mg | INTRAMUSCULAR | Status: DC | PRN
  Administered 2011-10-30 – 2011-10-31 (×6): 1 mg via INTRAVENOUS
  Filled 2011-10-30 (×6): qty 1

## 2011-10-30 MED ORDER — PROMETHAZINE HCL 25 MG PO TABS: 25.0000 mg | ORAL_TABLET | Freq: Four times a day (QID) | ORAL | Status: AC | PRN

## 2011-10-30 MED ORDER — METHOCARBAMOL 100 MG/ML IJ SOLN
500.0000 mg | Freq: Four times a day (QID) | INTRAVENOUS | Status: DC | PRN
  Filled 2011-10-30: qty 5

## 2011-10-30 MED ORDER — CEFAZOLIN SODIUM 1-5 GM-% IV SOLN
1.0000 g | Freq: Four times a day (QID) | INTRAVENOUS | Status: AC
  Administered 2011-10-30 – 2011-10-31 (×3): 1 g via INTRAVENOUS
  Filled 2011-10-30 (×3): qty 50

## 2011-10-30 MED ORDER — ONDANSETRON HCL 4 MG/2ML IJ SOLN: 4.0000 mg | Freq: Four times a day (QID) | INTRAMUSCULAR | Status: DC | PRN

## 2011-10-30 MED ORDER — OXYCODONE-ACETAMINOPHEN 10-325 MG PO TABS: 1.0000 | ORAL_TABLET | Freq: Four times a day (QID) | ORAL | Status: AC | PRN

## 2011-10-30 MED ORDER — ASPIRIN EC 81 MG PO TBEC
81.0000 mg | DELAYED_RELEASE_TABLET | Freq: Every day | ORAL | Status: DC
  Administered 2011-10-30 – 2011-10-31 (×2): 81 mg via ORAL
  Filled 2011-10-30 (×2): qty 1

## 2011-10-30 MED ORDER — ACETAMINOPHEN 325 MG PO TABS: 650.0000 mg | ORAL_TABLET | Freq: Four times a day (QID) | ORAL | Status: DC | PRN

## 2011-10-30 MED ORDER — MIDAZOLAM HCL 2 MG/2ML IJ SOLN: 1.0000 mg | INTRAMUSCULAR | Status: DC | PRN

## 2011-10-30 MED ORDER — SUFENTANIL CITRATE 50 MCG/ML IV SOLN
INTRAVENOUS | Status: DC | PRN
  Administered 2011-10-30: 20 ug via INTRAVENOUS
  Administered 2011-10-30 (×3): 5 ug via INTRAVENOUS

## 2011-10-30 MED ORDER — ACETAMINOPHEN 650 MG RE SUPP: 650.0000 mg | Freq: Four times a day (QID) | RECTAL | Status: DC | PRN

## 2011-10-30 MED ORDER — METOCLOPRAMIDE HCL 5 MG PO TABS
5.0000 mg | ORAL_TABLET | Freq: Three times a day (TID) | ORAL | Status: DC | PRN
  Filled 2011-10-30: qty 2

## 2011-10-30 MED ORDER — ONDANSETRON HCL 4 MG PO TABS: 4.0000 mg | ORAL_TABLET | Freq: Four times a day (QID) | ORAL | Status: DC | PRN

## 2011-10-30 MED ORDER — OXYCODONE-ACETAMINOPHEN 5-325 MG PO TABS: 1.0000 | ORAL_TABLET | ORAL | Status: DC | PRN

## 2011-10-30 MED ORDER — METOCLOPRAMIDE HCL 5 MG/ML IJ SOLN
5.0000 mg | Freq: Three times a day (TID) | INTRAMUSCULAR | Status: DC | PRN
Start: 2011-10-30 — End: 2011-10-31
  Filled 2011-10-30: qty 2

## 2011-10-30 MED ORDER — HYDROMORPHONE HCL PF 1 MG/ML IJ SOLN
0.2500 mg | INTRAMUSCULAR | Status: DC | PRN
  Administered 2011-10-30 (×2): 0.5 mg via INTRAVENOUS

## 2011-10-30 MED ORDER — CARVEDILOL 6.25 MG PO TABS
6.2500 mg | ORAL_TABLET | Freq: Two times a day (BID) | ORAL | Status: DC
  Administered 2011-10-30 – 2011-10-31 (×2): 6.25 mg via ORAL
  Filled 2011-10-30 (×5): qty 1

## 2011-10-30 MED ORDER — METHOCARBAMOL 500 MG PO TABS
500.0000 mg | ORAL_TABLET | Freq: Four times a day (QID) | ORAL | Status: DC | PRN
  Filled 2011-10-30: qty 1

## 2011-10-30 MED ORDER — ONDANSETRON HCL 4 MG/2ML IJ SOLN: 4.0000 mg | Freq: Once | INTRAMUSCULAR | Status: DC | PRN

## 2011-10-30 MED ORDER — METHOCARBAMOL 100 MG/ML IJ SOLN
500.0000 mg | INTRAVENOUS | Status: AC
  Administered 2011-10-30: 500 mg via INTRAVENOUS
  Filled 2011-10-30: qty 5

## 2011-10-30 MED ORDER — PHENOL 1.4 % MT LIQD
1.0000 | OROMUCOSAL | Status: DC | PRN
  Filled 2011-10-30: qty 177

## 2011-10-30 MED ORDER — MENTHOL 3 MG MT LOZG
1.0000 | LOZENGE | OROMUCOSAL | Status: DC | PRN
  Filled 2011-10-30: qty 9

## 2011-10-30 MED ORDER — MEPERIDINE HCL 25 MG/ML IJ SOLN: 6.2500 mg | INTRAMUSCULAR | Status: DC | PRN

## 2011-10-30 SURGICAL SUPPLY — 82 items
ANCHOR BUTTON TIGHTROPE ACL RT (Orthopedic Implant) ×4 IMPLANT
BANDAGE ELASTIC 6 VELCRO ST LF (GAUZE/BANDAGES/DRESSINGS) ×4 IMPLANT
BANDAGE ESMARK 6X9 LF (GAUZE/BANDAGES/DRESSINGS) ×3 IMPLANT
BENZOIN TINCTURE PRP APPL 2/3 (GAUZE/BANDAGES/DRESSINGS) ×4 IMPLANT
BIT DRILL 3.5 CANN STRL (BIT) ×4 IMPLANT
BLADE CUDA 5.5 (BLADE) IMPLANT
BLADE CUTTER GATOR 3.5 (BLADE) ×4 IMPLANT
BLADE GREAT WHITE 4.2 (BLADE) ×4 IMPLANT
BLADE LONG MED 31X9 (MISCELLANEOUS) ×4 IMPLANT
BLADE SURG 15 STRL LF DISP TIS (BLADE) ×3 IMPLANT
BLADE SURG 15 STRL SS (BLADE) ×1
BNDG ESMARK 6X9 LF (GAUZE/BANDAGES/DRESSINGS) ×4
BUR OVAL 6.0 (BURR) ×4 IMPLANT
CANISTER SUCT LVC 12 LTR MEDI- (MISCELLANEOUS) IMPLANT
CLOTH BEACON ORANGE TIMEOUT ST (SAFETY) ×4 IMPLANT
COVER SURGICAL LIGHT HANDLE (MISCELLANEOUS) ×4 IMPLANT
COVER TABLE BACK 60X90 (DRAPES) IMPLANT
CUFF TOURNIQUET SINGLE 34IN LL (TOURNIQUET CUFF) IMPLANT
CUFF TOURNIQUET SINGLE 44IN (TOURNIQUET CUFF) IMPLANT
DRAPE ARTHROSCOPY W/POUCH 114 (DRAPES) ×4 IMPLANT
DRAPE OEC MINIVIEW 54X84 (DRAPES) ×8 IMPLANT
DRAPE PROXIMA HALF (DRAPES) IMPLANT
DRSG ADAPTIC 3X8 NADH LF (GAUZE/BANDAGES/DRESSINGS) ×4 IMPLANT
DRSG EMULSION OIL 3X3 NADH (GAUZE/BANDAGES/DRESSINGS) IMPLANT
DURAPREP 26ML APPLICATOR (WOUND CARE) ×4 IMPLANT
ELECT MENISCUS 165MM 90D (ELECTRODE) IMPLANT
FIBERSTICK 2 (SUTURE) ×4 IMPLANT
FLIPCUTTER II ×4 IMPLANT
GAUZE SPONGE 4X4 12PLY STRL LF (GAUZE/BANDAGES/DRESSINGS) ×4 IMPLANT
GLOVE BIO SURGEON STRL SZ7 (GLOVE) ×8 IMPLANT
GLOVE BIOGEL PI IND STRL 7.0 (GLOVE) ×3 IMPLANT
GLOVE BIOGEL PI IND STRL 8 (GLOVE) ×6 IMPLANT
GLOVE BIOGEL PI INDICATOR 7.0 (GLOVE) ×1
GLOVE BIOGEL PI INDICATOR 8 (GLOVE) ×2
GLOVE BIOGEL PI ORTHO PRO SZ7 (GLOVE) ×1
GLOVE ECLIPSE 7.5 STRL STRAW (GLOVE) ×8 IMPLANT
GLOVE PI ORTHO PRO STRL SZ7 (GLOVE) ×3 IMPLANT
GLOVE SURG ORTHO 8.0 STRL STRW (GLOVE) ×8 IMPLANT
GLOVE SURG SS PI 7.0 STRL IVOR (GLOVE) ×4 IMPLANT
GOWN STRL NON-REIN LRG LVL3 (GOWN DISPOSABLE) IMPLANT
IMMOBILIZER KNEE 22 UNIV (SOFTGOODS) IMPLANT
IMMOBILIZER KNEE 24 THIGH 36 (MISCELLANEOUS) IMPLANT
IMMOBILIZER KNEE 24 UNIV (MISCELLANEOUS)
KIT BASIN OR (CUSTOM PROCEDURE TRAY) ×4 IMPLANT
KIT ROOM TURNOVER OR (KITS) ×4 IMPLANT
KIT TRANSTIBIAL (DISPOSABLE) ×4 IMPLANT
KNIFE GRAFT ACL 10MM 5952 (MISCELLANEOUS) IMPLANT
NS IRRIG 1000ML POUR BTL (IV SOLUTION) ×4 IMPLANT
PACK ARTHROSCOPY DSU (CUSTOM PROCEDURE TRAY) ×4 IMPLANT
PAD ARMBOARD 7.5X6 YLW CONV (MISCELLANEOUS) ×4 IMPLANT
PAD CAST 4YDX4 CTTN HI CHSV (CAST SUPPLIES) IMPLANT
PADDING CAST COTTON 4X4 STRL (CAST SUPPLIES)
PADDING WEBRIL 4 STERILE (GAUZE/BANDAGES/DRESSINGS) ×4 IMPLANT
PADDING WEBRIL 6 STERILE (GAUZE/BANDAGES/DRESSINGS) ×4 IMPLANT
PASSER SUT SWANSON 36MM LOOP (INSTRUMENTS) IMPLANT
PENCIL BUTTON HOLSTER BLD 10FT (ELECTRODE) ×4 IMPLANT
SET ARTHROSCOPY TUBING (MISCELLANEOUS) ×1
SET ARTHROSCOPY TUBING LN (MISCELLANEOUS) ×3 IMPLANT
SPONGE GAUZE 4X4 12PLY (GAUZE/BANDAGES/DRESSINGS) IMPLANT
SPONGE LAP 4X18 X RAY DECT (DISPOSABLE) ×4 IMPLANT
STRIP CLOSURE SKIN 1/2X4 (GAUZE/BANDAGES/DRESSINGS) ×4 IMPLANT
SUCTION FRAZIER TIP 10 FR DISP (SUCTIONS) ×4 IMPLANT
SUT 2 FIBERLOOP 20 STRT BLUE (SUTURE) ×8
SUT ETHIBOND 2 OS 4 DA (SUTURE) IMPLANT
SUT ETHILON 4 0 PS 2 18 (SUTURE) IMPLANT
SUT MNCRL AB 3-0 PS2 18 (SUTURE) ×4 IMPLANT
SUT PDS AB 1 CT  36 (SUTURE) ×2
SUT PDS AB 1 CT 36 (SUTURE) ×6 IMPLANT
SUT STEEL 5 (SUTURE) IMPLANT
SUT VIC AB 0 CT1 27 (SUTURE)
SUT VIC AB 0 CT1 27XBRD ANBCTR (SUTURE) IMPLANT
SUT VIC AB 2-0 SH 27 (SUTURE) ×1
SUT VIC AB 2-0 SH 27XBRD (SUTURE) ×3 IMPLANT
SUTURE 2 FIBERLOOP 20 STRT BLU (SUTURE) ×6 IMPLANT
TENDON ANTERIOR TIBIALIS (Tissue) ×4 IMPLANT
TOWEL OR 17X24 6PK STRL BLUE (TOWEL DISPOSABLE) ×4 IMPLANT
TOWEL OR 17X26 10 PK STRL BLUE (TOWEL DISPOSABLE) ×4 IMPLANT
TRAY FOLEY CATH 14FR (SET/KITS/TRAYS/PACK) IMPLANT
WAND 90 DEG TURBOVAC W/CORD (SURGICAL WAND) ×4 IMPLANT
WATER STERILE IRR 1000ML POUR (IV SOLUTION) ×4 IMPLANT
WRAP KNEE MAXI GEL POST OP (GAUZE/BANDAGES/DRESSINGS) ×4 IMPLANT
biocomposite interference screw (Screw) ×4 IMPLANT

## 2011-10-30 NOTE — Transfer of Care (Signed)
Immediate Anesthesia Transfer of Care Note  Patient: Kevin Simpson  Procedure(s) Performed:  RECONSTRUCTION ANTERIOR CRUCIATE LIGAMENT (ACL); MENISECTOMY - medial and lateral  Patient Location: PACU  Anesthesia Type: General  Level of Consciousness: awake, alert , oriented and patient cooperative  Airway & Oxygen Therapy: Patient Spontanous Breathing and Patient connected to nasal cannula oxygen  Post-op Assessment: Report given to PACU RN, Post -op Vital signs reviewed and stable and Patient moving all extremities  Post vital signs: Reviewed and stable Filed Vitals:   10/30/11 1645  BP:   Pulse: 82  Temp: 36.4 C  Resp: 22    Complications: No apparent anesthesia complications

## 2011-10-30 NOTE — Op Note (Signed)
10/30/2011  4:27 PM  PATIENT:  Kevin Simpson    PRE-OPERATIVE DIAGNOSIS:  LEFT KNEE Anterior cruciate ligament TEAR, MEDIAL Meniscal TEAR  POST-OPERATIVE DIAGNOSIS:  Left anterior cruciate ligament tear, medial meniscus tear, lateral meniscus tear  PROCEDURE:  RECONSTRUCTION ANTERIOR CRUCIATE LIGAMENT (ACL), partial lateral and partial medial meniscectomy  SURGEON:  Tayshawn Purnell P  PHYSICIAN ASSISTANT: Janace Litten, OPA-C, present and scrubbed throughout the case, critical for completion in a timely fashion, and for retraction, instrumentation, and closure.  ANESTHESIA:   General  PREOPERATIVE INDICATIONS:  Kevin Simpson is a  26 y.o. male with a diagnosis of LEFT KNEE Anterior cruciate ligament TEAR, MEDIAL Meniscal TEAR who failed conservative measures and elected for surgical management.    The risks benefits and alternatives were discussed with the patient preoperatively including but not limited to the risks of infection, bleeding, nerve injury, cardiopulmonary complications, the need for revision surgery, among others, and the patient was willing to proceed. We also discussed the risks of progressive knee arthritis, recurrent knee instability, transmission of disease, cardiac pulmonary complications, among others and is willing to proceed.  OPERATIVE IMPLANTS: Tibialis anterior allograft, Arthrex anterior cruciate ligament tightrope with a size 10 x 35 mm composite interference screw for the tibia.  OPERATIVE FINDINGS: The medial meniscus had a large bucket-handle tear that was flipped into the notch. The tissue was poor quality and not amenable to repair. The lateral meniscus also had a small central tear. This was longitudinal in configuration appeared the articular cartilage of the medial femoral condyle had some grade 1 changes. The articular cartilage of the lateral femoral condyle was intact. The medial side I could actually see a deep groove where the meniscus had  doesn't into the cartilage. The PCL was intact. His posterior lateral corner felt clinically intact, as was the lateral collateral ligament during exam under anesthesia. Bowel test was negative and he was stable to varus and valgus stress.  He had a positive Lachman and a positive pivot shift with a completely disrupted anterior cruciate ligament during operative arthroscopy.  OPERATIVE PROCEDURE: The patient is brought to the operating room and placed in supine position. IV antibiotics were given. General anesthesia was administered. [facility]'s operation [facility] because of his cardiomyopathy. Regional block was also performed. After time out was performed examining anesthesia was performed with the above-named findings. The left lower extremity was prepped and draped in usual sterile fashion. Diagnostic arthroscopy was carried out the above-named findings. I used the arthroscopic biter and the arthroscopic shaver to debride the medial meniscus back to a stable configuration. Cleaning access to the posterior horn of the medial meniscus was extremely difficult. I was able to achieve a satisfactory debridement.  I also used the arthroscopic biter and the arthroscopic shaver to remove a small amount of the central portion of the lateral meniscus. This is debrided back to a stable rim.  I then resected the previous anterior cruciate ligament that was torn, performed a very light notchplasty, and then used the outside and foot cutter 9.5 mm for the femoral tunnel. Appropriate position was achieved.  I then used the foot cutter on the tibia, and then opened the cortex with the 9. 0 reamer, and then dilated up to a 9.5. Passing sutures were brought through the tibia, and the tibialis anterior allograft had artery been prepared by Janace Litten, orthopedic PA-C. The graft was then passed, and the button flipped, and I since the graft bringing about 35  mm of tissue into the tunnel.  I then  cycled the knee and had excellent isometry. A C-arm was used to confirm position of the button. I then used a guidewire to place the interference screw. The dilator went fairly easily, so I used a 10 mm screw. Additionally the length was measured off of the jig during reaming.  A reverse Lachman was applied, and I placed interference screw. This had excellent purchase. I visualized the tip of the screw directly at the joint line within the joint. This was not prominent. I back to the guidewire out, and unfortunately the guidewire was inadvertently broken during placement of the screw. I then removed the portion described guidewire that was in the joint itself. This came out in one piece after advanced it into the joint. I used the C-arm to confirm appropriate position of the button, as well as position of the bio composite interference screw. The suture was cut off of the femoral side, and the passing suture also removed. I then cut the graft distal to the tibial screw. The locking had been restored, and the knee had full motion. The wounds and knee was irrigated copiously, and the portals repaired with Monocryl followed by Steri-Strips and sterile gauze. The patient was awakened and returned to the PACU in stable and satisfactory condition. There no complications and he tolerated the procedure well.

## 2011-10-30 NOTE — Anesthesia Postprocedure Evaluation (Signed)
  Anesthesia Post-op Note  Patient: Kevin Simpson  Procedure(s) Performed:  RECONSTRUCTION ANTERIOR CRUCIATE LIGAMENT (ACL); MENISECTOMY - medial and lateral  Patient Location: PACU  Anesthesia Type: General  Level of Consciousness: awake, alert , oriented and patient cooperative  Airway and Oxygen Therapy: Patient Spontanous Breathing and Patient connected to nasal cannula oxygen  Post-op Pain: mild  Post-op Assessment: Post-op Vital signs reviewed, Patient's Cardiovascular Status Stable, Respiratory Function Stable, Patent Airway, No signs of Nausea or vomiting and Pain level controlled  Post-op Vital Signs: stable  Complications: No apparent anesthesia complications

## 2011-10-30 NOTE — Anesthesia Procedure Notes (Addendum)
Anesthesia Regional Block:  Femoral nerve block  Pre-Anesthetic Checklist: ,, timeout performed, Correct Patient, Correct Site, Correct Laterality, Correct Procedure,, site marked, risks and benefits discussed, Surgical consent,  Pre-op evaluation,  At surgeon's request and post-op pain management  Laterality: Left  Prep: chloraprep       Needles:  Injection technique: Single-shot  Needle Type: Echogenic Stimulator Needle     Needle Length: 9cm  Needle Gauge: 21    Additional Needles:  Procedures: ultrasound guided and nerve stimulator Femoral nerve block  Nerve Stimulator or Paresthesia:  Response: Quadriceps muscle contraction, 0.45 mA,   Additional Responses:   Narrative:  Start time: 10/30/2011 1:30 PM End time: 10/30/2011 1:45 PM Injection made incrementally with aspirations every 5 mL.  Performed by: Personally  Anesthesiologist: Arta Bruce MD  Additional Notes: Functioning IV was confirmed and monitors were applied.  A 90mm 21ga Arrow echogenic stimulator needle was used. Sterile prep and drape,hand hygiene and sterile gloves were used.  Negative aspiration and negative test dose prior to incremental administration of local anesthetic. The patient tolerated the procedure well.    Femoral nerve block Procedure Name: LMA Insertion Date/Time: 10/30/2011 2:08 PM Performed by: Darcey Nora Pre-anesthesia Checklist: Patient identified, Emergency Drugs available, Suction available and Patient being monitored Patient Re-evaluated:Patient Re-evaluated prior to inductionOxygen Delivery Method: Circle System Utilized Preoxygenation: Pre-oxygenation with 100% oxygen Intubation Type: IV induction Ventilation: Mask ventilation without difficulty LMA Size: 5.0 Number of attempts: 1 Placement Confirmation: positive ETCO2 and breath sounds checked- equal and bilateral Future Recommendations: Recommend- induction with short-acting agent, and alternative techniques readily  available    Performed by: Darcey Nora

## 2011-10-30 NOTE — Preoperative (Signed)
Beta Blockers   Reason not to administer Beta Blockers:Coreg 1100 10/29/2012

## 2011-10-30 NOTE — H&P (Signed)
PREOPERATIVE H&P  Chief Complaint: LEFT KNEE ACL TEAR, LEFT KNEE MEDIAL MENISCAL TEAR  HPI: Kevin Simpson is a 26 y.o. male who presents for preoperative history and physical with a diagnosis of LEFT KNEE ACL TEAR, LEFT KNEE MEDIAL MENISCAL TEAR. Symptoms are rated as moderate to severe, and have been worsening.  This is significantly impairing activities of daily living.  He has elected for surgical management. He complains of ongoing instability.  Past Medical History  Diagnosis Date  . Torn ACL   . DCM (dilated cardiomyopathy)     EF 37% by Echo 07/19/11  . PVC's (premature ventricular contractions)     s/p EPS and ablation 08/10/11 with PVC unsuccesfully ablated  . Hypertension    Past Surgical History  Procedure Date  . Ep study and radiofrequency ablation 08/10/11    PVC ablation,  The PVCs were of a right bundle-branch right inferior axis and were felt to arise from the anterolateral  papillary muscle but could not be successfully ablated  . Wisdom tooth extraction    History   Social History  . Marital Status: Single    Spouse Name: N/A    Number of Children: N/A  . Years of Education: N/A   Occupational History  . furniture delivery    Social History Main Topics  . Smoking status: Never Smoker   . Smokeless tobacco: Never Used  . Alcohol Use: Yes     3 times per week  . Drug Use: Yes    Special: Marijuana, Cocaine     marijuana not currentlt using  . Sexually Active: None   Other Topics Concern  . None   Social History Narrative  . None   History reviewed. No pertinent family history. No Known Allergies Prior to Admission medications   Medication Sig Start Date End Date Taking? Authorizing Provider  aspirin EC 81 MG tablet Take 81 mg by mouth daily.     Yes Historical Provider, MD  carvedilol (COREG) 6.25 MG tablet Take 1 tablet (6.25 mg total) by mouth 2 (two) times daily with a meal. 09/06/11  Yes Gardiner Rhyme, MD  lisinopril (PRINIVIL,ZESTRIL)  10 MG tablet Take 1 tablet (10 mg total) by mouth daily. 10/23/11 10/22/12 Yes Gardiner Rhyme, MD     Positive ROS: All other systems have been reviewed and were otherwise negative with the exception of those mentioned in the HPI and as above.  Physical Exam: General: Alert, no acute distress Cardiovascular: No pedal edema Respiratory: No cyanosis, no use of accessory musculature GI: No organomegaly, abdomen is soft and non-tender Skin: No lesions in the area of chief complaint Neurologic: Sensation intact distally Psychiatric: Patient is competent for consent with normal mood and affect Lymphatic: No axillary or cervical lymphadenopathy  MUSCULOSKELETAL: Left knee has positive Lachman, and mild medial joint line tenderness. He also has some pain laterally.  Assessment: LEFT KNEE ACL TEAR, left knee capsular medial meniscus tear, questionable significance.  Plan: Plan for Procedure(s): KNEE ARTHROSCOPY WITH ANTERIOR CRUCIATE LIGAMENT RECONSTRUCTION, POSSIBLE MENISCAL DEBRIDEMENT VERSUS REPAIR. HE HAS REQUESTED ALLOGRAFT RECONSTRUCTION.  The risks benefits and alternatives were discussed with the patient including but not limited to the risks of nonoperative treatment, versus surgical intervention including infection, bleeding, nerve injury,  blood clots, cardiopulmonary complications, morbidity, mortality, among others, and they were willing to proceed. We have also discussed the risk of recurrent anterior cruciate ligament disruption, progression of knee arthritis, incomplete relief of symptoms, among others. He is willing to  proceed, and has been optimized from a cardiac standpoint.  Edyn Qazi P 10/30/2011 1:24 PM

## 2011-10-30 NOTE — Progress Notes (Signed)
Pt arrived to holding area and placed on monitor. Pt noted to be in a junctional rhythm intermittently going into a sinus rhythm with frequent PAC's pt also reports fluttering in chest. Strips printed. Dr. Michelle Piper aware to review strips.

## 2011-10-31 ENCOUNTER — Encounter (HOSPITAL_COMMUNITY): Payer: Self-pay | Admitting: Orthopedic Surgery

## 2011-10-31 NOTE — Progress Notes (Signed)
D/c orders received; IV removed with gauze on; pt remains in stable condition, pt meds and instructions reviewed and given to pt and pt family

## 2011-10-31 NOTE — Progress Notes (Signed)
Physical Therapy Evaluation Patient Details Name: Kevin Simpson MRN: [id number] DOB: 1986/01/11 Today's Date: 10/31/2011  Problem List:  Patient Active Problem List  Diagnoses  . Dilated cardiomyopathy  . PVC's (premature ventricular contractions)  . ACL (anterior cruciate ligament) rupture, left  . Acute medial meniscus tear of left knee  . Acute lateral meniscus tear of left knee    Past Medical History:  Past Medical History  Diagnosis Date  . Torn ACL   . DCM (dilated cardiomyopathy)     EF 37% by Echo 07/19/11  . PVC's (premature ventricular contractions)     s/p EPS and ablation 08/10/11 with PVC unsuccesfully ablated  . Hypertension   . ACL (anterior cruciate ligament) rupture, left 10/30/2011  . Acute medial meniscus tear of left knee 10/30/2011  . Acute lateral meniscus tear of left knee 10/30/2011   Past Surgical History:  Past Surgical History  Procedure Date  . Ep study and radiofrequency ablation 08/10/11    PVC ablation,  The PVCs were of a right bundle-branch right inferior axis and were felt to arise from the anterolateral  papillary muscle but could not be successfully ablated  . Wisdom tooth extraction     PT Assessment/Plan/Recommendation PT Assessment Clinical Impression Statement: Pt s/p ACL reconstruction and meniscectomy on L evaluated and moving well. Also had ablation.  Pt has used crutches in the past and is currently safe with these with mobility. Plans to d/c home to parents house and verbalizies understanding of recommendation for parents to be with him for steps for safety. States he may sleep on couch down stairs but can safely enter the home.   Plan d/c today. PT Recommendation/Assessment: All further PT needs can be met in the next venue of care PT Problem List: Pain Problem List Comments: performed toilet transfer from low toliet which pt did fine, will not need BSC PT Therapy Diagnosis : Difficulty walking PT Recommendation Follow Up  Recommendations: Outpatient PT Equipment Recommended: None recommended by PT PT Goals   none set, eval only, D/C today (Note- ablation was in 10/12) PT Evaluation Precautions/Restrictions  Precautions Precaution Comments: KI Required Braces or Orthoses: Yes Knee Immobilizer: Discontinue post op day 2 Restrictions Weight Bearing Restrictions: No Other Position/Activity Restrictions: WBAT Prior Functioning  Home Living Lives With:  (to d/c to parents home) Receives Help From: Family Type of Home: House Home Layout: Two level;Able to live on main level with bedroom/bathroom Home Access: Stairs to enter Entrance Stairs-Rails: None Entrance Stairs-Number of Steps: 3 Home Adaptive Equipment: Crutches Prior Function Level of Independence: Independent with basic ADLs;Independent with homemaking with ambulation;Independent with gait Driving: Yes Cognition Cognition Arousal/Alertness: Awake/alert Overall Cognitive Status: Appears within functional limits for tasks assessed Orientation Level: Oriented X4 Sensation/Coordination Sensation Light Touch: Appears Intact Proprioception: Appears Intact Extremity Assessment RLE Assessment RLE Assessment: Within Functional Limits LLE Assessment LLE Assessment: Exceptions to [facility] LLE Strength LLE Overall Strength: Deficits;Due to precautions;Due to pain LLE Overall Strength Comments: knee not tested, ankle and hip move against gravity Mobility (including Balance) Bed Mobility Bed Mobility: Yes Supine to Sit: 7: Independent Transfers Transfers: Yes Sit to Stand: 6: Modified independent (Device/Increase time);With upper extremity assist;From toilet;From chair/3-in-1;From bed Stand to Sit: 6: Modified independent (Device/Increase time);Without upper extremity assist;To toilet;To chair/3-in-1;To bed Stand Pivot Transfers: 6: Modified independent (Device/Increase time) (crutches) Ambulation/Gait Ambulation/Gait: Yes Ambulation/Gait  Assistance: 5: Supervision (d/t IV) Ambulation/Gait Assistance Details (indicate cue type and reason): steady 3 point gait without LOB with head turns, turns  or conversation Ambulation Distance (Feet): 300 Feet Gait Pattern: Step-through pattern Gait velocity: initial cue to try to weight bear on LLE Stairs: Yes Stairs Assistance: 5: Supervision Stair Management Technique: Forwards;No rails;With crutches (initial cue for technique then return demonstrated) Number of Stairs: 3  Wheelchair Mobility Wheelchair Mobility: No  Balance Balance Assessed: Yes (mod I, able to withstand perturbations) High Level Balance High Level Balance Comments: able to stand statically without UE A Exercise    End of Session PT - End of Session Equipment Utilized During Treatment: Gait belt Activity Tolerance: Patient tolerated treatment well Patient left: in chair Nurse Communication:  (nurse observed tx) General Behavior During Session: [facility] for tasks performed Cognition: [facility] for tasks performed  Michaelene Song 10/31/2011, 9:29 AM

## 2011-10-31 NOTE — Progress Notes (Signed)
Called and talked with Janace Litten, PA of Dr.Landau; was told that pt can be d/c home today, paper work done yesterday

## 2011-10-31 NOTE — Progress Notes (Signed)
Procedure(s) (LRB): RECONSTRUCTION ANTERIOR CRUCIATE LIGAMENT (ACL) () MENISECTOMY (Left) 1 Day Post-Op   Subjective:  Patient reports pain as moderate.  Denies cp.    Objective:   VITALS:  BP 99/58  Pulse 49  Temp(Src) 98.3 F (36.8 C) (Oral)  Resp 18  SpO2 95%  Neurologically intact Dorsiflexion/Plantar flexion intact Incision: dressing C/D/I  LABS Lab Results  Component Value Date   HGB 16.5 10/24/2011   HGB 16.1 08/08/2011   Lab Results  Component Value Date   WBC 6.7 10/24/2011   PLT 263 10/24/2011   No results found for this basename: INR   Lab Results  Component Value Date   NA 139 10/24/2011   K 4.3 10/24/2011   CL 101 10/24/2011   CO2 27 10/24/2011   BUN 14 10/24/2011   CREATININE 1.36* 10/24/2011   GLUCOSE 99 10/24/2011    Assessment/Plan: Principal Problem:  *ACL (anterior cruciate ligament) rupture, left Active Problems:  Acute medial meniscus tear of left knee  Acute lateral meniscus tear of left knee   Advance diet Up with therapy DC home today   Korri Ask P 10/31/2011, 7:58 AM

## 2011-11-01 NOTE — Discharge Summary (Signed)
Physician Discharge Summary  Patient ID: Kevin Simpson MRN: [id number] DOB/AGE: 1986/08/07 26 y.o.  Admit date: 10/30/2011 Discharge date: 10/31/2011  Admission Diagnoses:  ACL (anterior cruciate ligament) rupture, medial and lateral meniscal tears  Discharge Diagnoses:  Principal Problem:  *ACL (anterior cruciate ligament) rupture, left Active Problems:  Acute medial meniscus tear of left knee  Acute lateral meniscus tear of left knee   Past Medical History  Diagnosis Date  . Torn ACL   . DCM (dilated cardiomyopathy)     EF 37% by Echo 07/19/11  . PVC's (premature ventricular contractions)     s/p EPS and ablation 08/10/11 with PVC unsuccesfully ablated  . Hypertension   . ACL (anterior cruciate ligament) rupture, left 10/30/2011  . Acute medial meniscus tear of left knee 10/30/2011  . Acute lateral meniscus tear of left knee 10/30/2011    Surgeries: Procedure(s): RECONSTRUCTION ANTERIOR CRUCIATE LIGAMENT (ACL) MENISECTOMY on 10/30/2011   Consultants (if any):    Discharged Condition: Improved  Hospital Course: Kevin Simpson is an 26 y.o. male who was admitted 10/30/2011 with a diagnosis of ACL (anterior cruciate ligament) rupture and went to the operating room on 10/30/2011 and underwent the above named procedures.    He was given perioperative antibiotics:  Anti-infectives     Start     Dose/Rate Route Frequency Ordered Stop   10/30/11 1930   ceFAZolin (ANCEF) IVPB 1 g/50 mL premix        1 g 100 mL/hr over 30 Minutes Intravenous Every 6 hours 10/30/11 1825 10/31/11 0827   10/30/11 0000   ceFAZolin (ANCEF) IVPB 2 g/50 mL premix        2 g 100 mL/hr over 30 Minutes Intravenous 60 min pre-op 10/29/11 1102 10/30/11 1411        .  He was given sequential compression devices, early ambulation, for DVT prophylaxis. He was also monitored overnight on telemetry do to his history of cardiac arrhythmia  He benefited maximally from their hospital stay and there  were no complications.    Recent vital signs:  Filed Vitals:   10/31/11 1000  BP: 108/52  Pulse: 55  Temp: 98.7 F (37.1 C)  Resp: 16    Recent laboratory studies:  Lab Results  Component Value Date   HGB 16.5 10/24/2011   HGB 16.1 08/08/2011   Lab Results  Component Value Date   WBC 6.7 10/24/2011   PLT 263 10/24/2011   No results found for this basename: INR   Lab Results  Component Value Date   NA 139 10/24/2011   K 4.3 10/24/2011   CL 101 10/24/2011   CO2 27 10/24/2011   BUN 14 10/24/2011   CREATININE 1.36* 10/24/2011   GLUCOSE 99 10/24/2011    Discharge Medications:   Discharge Medication List as of 10/31/2011 12:21 PM    START taking these medications   Details  methocarbamol (ROBAXIN) 500 MG tablet Take 1 tablet (500 mg total) by mouth 4 (four) times daily., Starting 10/30/2011, Until Thu 11/09/11, Print    oxyCODONE-acetaminophen (PERCOCET) 10-325 MG per tablet Take 1-2 tablets by mouth every 6 (six) hours as needed for pain. MAXIMUM TOTAL ACETAMINOPHEN DOSE IS 4000 MG PER DAY, Starting 10/30/2011, Until Thu 11/09/11, Normal    promethazine (PHENERGAN) 25 MG tablet Take 1 tablet (25 mg total) by mouth every 6 (six) hours as needed for nausea., Starting 10/30/2011, Until Mon 11/06/11, Print      CONTINUE these medications which have NOT CHANGED  Details  aspirin EC 81 MG tablet Take 81 mg by mouth daily.  , Until Discontinued, Historical Med    carvedilol (COREG) 6.25 MG tablet Take 1 tablet (6.25 mg total) by mouth 2 (two) times daily with a meal., Starting 09/06/2011, Until Discontinued, Normal    lisinopril (PRINIVIL,ZESTRIL) 10 MG tablet Take 1 tablet (10 mg total) by mouth daily., Starting 10/23/2011, Until Tue 10/22/12, Normal        Diagnostic Studies: Dg Chest 2 View  10/24/2011  *RADIOLOGY REPORT*  Clinical Data: Preop repair ACL left knee  CHEST - 2 VIEW  Comparison: None.  Findings: Normal heart size.  Normal pulmonary vascularity.  Lungs are clear without infiltrate or  effusion.  No mass or nodule is present.  IMPRESSION: Negative  Original Report Authenticated By: Camelia Phenes, M.D.    Disposition: Home or Self Care  Discharge Orders    Future Orders Please Complete By Expires   Diet general      Call MD / Call 911      Comments:   If you experience chest pain or shortness of breath, CALL 911 and be transported to the hospital emergency room.  If you develope a fever above 101 F, pus (white drainage) or increased drainage or redness at the wound, or calf pain, call your surgeon's office.   Constipation Prevention      Comments:   Drink plenty of fluids.  Prune juice may be helpful.  You may use a stool softener, such as Colace (over the counter) 100 mg twice a day.  Use MiraLax (over the counter) for constipation as needed.   Increase activity slowly as tolerated      Weight Bearing as taught in Physical Therapy      Comments:   Use a walker or crutches as instructed.   Discharge wound care:      Comments:   If you have a hip bandage, keep it clean and dry.  Change your bandage as instructed by your health care providers.  If your bandage has been discontinued, keep your incision clean and dry.  Pat dry after bathing.  DO NOT put lotion or powder on your incision.   Discharge instructions      Comments:   Okay to put as much weight on the leg as comfortable. Use crutches for the first week or so. Keep wounds dry.      Follow-up Information    Follow up with Cristin Szatkowski P, MD in 2 weeks.   Contact information:   [facility] [address], [address] [place] [address] [phone]           Signed: Eulas Post 11/01/2011, 7:44 PM

## 2012-01-10 ENCOUNTER — Telehealth: Payer: Self-pay | Admitting: Internal Medicine

## 2012-01-10 NOTE — Telephone Encounter (Signed)
Will forward to Dr Johney Frame In EP lab that day and off the following

## 2012-01-10 NOTE — Telephone Encounter (Signed)
Pt calling re  procedure with dr Staci Righter s scheduled  may 30th, dr allred was to go to [place] to be there for may 30th abaltion, checking to see if works with his schedule?

## 2012-03-08 ENCOUNTER — Telehealth: Payer: Self-pay | Admitting: Internal Medicine

## 2012-03-08 NOTE — Telephone Encounter (Signed)
03-08-12 pt will be having ablation in [place] the end of may, will call back then to set up appt with allred/mt

## 2012-03-14 ENCOUNTER — Encounter: Payer: Self-pay | Admitting: Internal Medicine

## 2012-03-20 NOTE — Telephone Encounter (Signed)
I spoke with Dr Staci Righter from [facility].  The patient is s/p repeat ablation there. Please schedule for him to see me in about 4 weeks.

## 2012-04-22 ENCOUNTER — Encounter (INDEPENDENT_AMBULATORY_CARE_PROVIDER_SITE_OTHER): Payer: BC Managed Care – PPO

## 2012-04-22 ENCOUNTER — Ambulatory Visit (INDEPENDENT_AMBULATORY_CARE_PROVIDER_SITE_OTHER): Payer: PRIVATE HEALTH INSURANCE | Admitting: Internal Medicine

## 2012-04-22 ENCOUNTER — Encounter: Payer: Self-pay | Admitting: Internal Medicine

## 2012-04-22 VITALS — HR 41 | Ht 73.0 in | Wt 195.8 lb

## 2012-04-22 DIAGNOSIS — I420 Dilated cardiomyopathy: Secondary | ICD-10-CM

## 2012-04-22 DIAGNOSIS — R002 Palpitations: Secondary | ICD-10-CM

## 2012-04-22 DIAGNOSIS — I493 Ventricular premature depolarization: Secondary | ICD-10-CM

## 2012-04-22 DIAGNOSIS — I4949 Other premature depolarization: Secondary | ICD-10-CM

## 2012-04-22 DIAGNOSIS — I428 Other cardiomyopathies: Secondary | ICD-10-CM

## 2012-04-22 NOTE — Progress Notes (Signed)
Kevin Maker, MD PCP  The patient presents today for electrophysiology followup.  Since his last visit, the patient reports doing well.  He remains minimally symptomatic with his PVCs.  He underwent repeat PVC ablation by Dr Stephenie Acres at [facility] 03/08/12.  Though his PVCs were initially suppressed, the focus was not completely ablated.  Today, he denies symptoms of palpitations, chest pain, shortness of breath, orthopnea, PND, lower extremity edema,  presyncope, syncope, or neurologic sequela.  The patient feels that he is tolerating medications without difficulties and is otherwise without complaint today.   Past Medical History  Diagnosis Date  . Torn ACL   . DCM (dilated cardiomyopathy)     EF 37% by Echo 07/19/11  . PVC's (premature ventricular contractions)     s/p EPS and ablation 08/10/11 at [facility] and 03/08/12 at [facility]  . Hypertension   . ACL (anterior cruciate ligament) rupture, left 10/30/2011  . Acute medial meniscus tear of left knee 10/30/2011  . Acute lateral meniscus tear of left knee 10/30/2011   Past Surgical History  Procedure Date  . Ep study and radiofrequency ablation 08/10/11, 03/08/12    PVC ablation  . Wisdom tooth extraction   . Anterior cruciate ligament repair 10/30/2011    Procedure: RECONSTRUCTION ANTERIOR CRUCIATE LIGAMENT (ACL);  Surgeon: Eulas Post;  Location: [facility] OR;  Service: Orthopedics;;  . Menisectomy 10/30/2011    Procedure: MENISECTOMY;  Surgeon: Eulas Post;  Location: [facility] OR;  Service: Orthopedics;  Laterality: Left;  medial and lateral    Current Outpatient Prescriptions  Medication Sig Dispense Refill  . aspirin EC 81 MG tablet Take 81 mg by mouth daily.        . carvedilol (COREG) 6.25 MG tablet Take 1 tablet (6.25 mg total) by mouth 2 (two) times daily with a meal.  60 tablet  6  . lisinopril (PRINIVIL,ZESTRIL) 10 MG tablet Take 1 tablet (10 mg total) by mouth daily.  30 tablet  6    No Known Allergies  History    Social History  . Marital Status: Single    Spouse Name: N/A    Number of Children: N/A  . Years of Education: N/A   Occupational History  . furniture delivery    Social History Main Topics  . Smoking status: Never Smoker   . Smokeless tobacco: Never Used  . Alcohol Use: Yes     3 times per week  . Drug Use: Yes    Special: Marijuana, Cocaine     marijuana not currentlt using  . Sexually Active: Not on file   Other Topics Concern  . Not on file   Social History Narrative  . No narrative on file   Physical Exam: Filed Vitals:   04/22/12 1643  Pulse: 41  Height: 6\' 1"  (1.854 m)  Weight: 195 lb 12.8 oz (88.814 kg)  SpO2: 96%    GEN- The patient is well appearing, alert and oriented x 3 today.   Head- normocephalic, atraumatic Eyes-  Sclera clear, conjunctiva pink Ears- hearing intact Oropharynx- clear Neck- supple, no JVP Lymph- no cervical lymphadenopathy Lungs- Clear to ausculation bilaterally, normal work of breathing Heart- Regularly irregular rhythm with bigeminal ectopy, no murmurs, rubs or gallops, PMI not laterally displaced GI- soft, NT, ND, + BS Extremities- no clubbing, cyanosis, or edema MS- no significant deformity or atrophy Skin- no rash or lesion Psych- euthymic mood, full affect Neuro- strength and sensation are intact  ekg today revealed sinus  rhythm with PVCs in bigeminy,  PVCs are RBB R inferior axis  Assessment and Plan:

## 2012-04-22 NOTE — Patient Instructions (Addendum)
Your physician has recommended that you wear a 24 hour holter monitor. Holter monitors are medical devices that record the heart's electrical activity. Doctors most often use these monitors to diagnose arrhythmias. Arrhythmias are problems with the speed or rhythm of the heartbeat. The monitor is a small, portable device. You can wear one while you do your normal daily activities. This is usually used to diagnose what is causing palpitations/syncope (passing out).  Your physician has requested that you have an echocardiogram on the day you come to be fitted with your holter monitor. Echocardiography is a painless test that uses sound waves to create images of your heart. It provides your doctor with information about the size and shape of your heart and how well your heart's chambers and valves are working. This procedure takes approximately one hour. There are no restrictions for this procedure.  Your physician recommends that you schedule a follow-up appointment in: 2 months with Dr. Johney Frame.

## 2012-04-22 NOTE — Assessment & Plan Note (Signed)
Repeat an echo at this point to assess response to medical therapy and ablation.

## 2012-04-22 NOTE — Assessment & Plan Note (Signed)
He returns today for follow-up after his PVC ablation at [facility].  Though he appeared to have early success, I am not optimistic as he has PVCs in bigeminy on ekg again today.  I will place a 24 hour holter and repeat his echo. I anticipate that he will require an antiarrhythmic drug at this point.  Given his prior depressed EF, I anticipate that I will use sotalol but will likely discuss this with Dr Lenna Gilford first.

## 2012-04-23 ENCOUNTER — Ambulatory Visit (HOSPITAL_COMMUNITY): Payer: PRIVATE HEALTH INSURANCE | Attending: Cardiology

## 2012-04-23 DIAGNOSIS — I517 Cardiomegaly: Secondary | ICD-10-CM | POA: Insufficient documentation

## 2012-04-23 DIAGNOSIS — I4949 Other premature depolarization: Secondary | ICD-10-CM | POA: Insufficient documentation

## 2012-04-23 DIAGNOSIS — I428 Other cardiomyopathies: Secondary | ICD-10-CM | POA: Insufficient documentation

## 2012-04-23 DIAGNOSIS — I10 Essential (primary) hypertension: Secondary | ICD-10-CM | POA: Insufficient documentation

## 2012-04-23 DIAGNOSIS — I493 Ventricular premature depolarization: Secondary | ICD-10-CM

## 2012-04-23 NOTE — Progress Notes (Signed)
Echocardiogram performed.  

## 2012-04-25 ENCOUNTER — Other Ambulatory Visit (HOSPITAL_COMMUNITY): Payer: PRIVATE HEALTH INSURANCE

## 2012-05-01 ENCOUNTER — Telehealth: Payer: Self-pay | Admitting: Internal Medicine

## 2012-05-01 NOTE — Telephone Encounter (Signed)
18 pages faxed to [facility] @ [phone] Monitor,Echo LOV (Allred) Main # [phone] MR # [phone] MR Fax [phone] 05/01/12/KM

## 2012-05-03 NOTE — Addendum Note (Signed)
Addended by: Debbe Bales on: 05/03/2012 02:18 PM   Modules accepted: Orders

## 2012-06-20 ENCOUNTER — Encounter: Payer: Self-pay | Admitting: Internal Medicine

## 2012-06-20 ENCOUNTER — Ambulatory Visit (INDEPENDENT_AMBULATORY_CARE_PROVIDER_SITE_OTHER): Payer: BC Managed Care – PPO | Admitting: Internal Medicine

## 2012-06-20 VITALS — BP 120/70 | HR 60 | Ht 73.0 in | Wt 194.0 lb

## 2012-06-20 DIAGNOSIS — I420 Dilated cardiomyopathy: Secondary | ICD-10-CM

## 2012-06-20 DIAGNOSIS — I4949 Other premature depolarization: Secondary | ICD-10-CM

## 2012-06-20 DIAGNOSIS — I493 Ventricular premature depolarization: Secondary | ICD-10-CM

## 2012-06-20 DIAGNOSIS — I428 Other cardiomyopathies: Secondary | ICD-10-CM

## 2012-06-20 MED ORDER — AMIODARONE HCL 200 MG PO TABS: ORAL_TABLET | ORAL | Status: DC

## 2012-06-20 NOTE — Patient Instructions (Signed)
Your physician recommends that you schedule a follow-up appointment in: 4 weeks with Dr Johney Frame   Your physician has recommended you make the following change in your medication:  1) Start Amiodarone 200mg  ----take 2 tablets twice daily for 2 weeks then decrease to 200mg  twice daily for two weeks then 200mg  daily

## 2012-06-23 NOTE — Assessment & Plan Note (Signed)
Possibly due to PVCs.  There is no delayed enhancement on MRI. We will reassess EF once PVCs are suppressed. Continue beta blocker and ACE inhibitor at this time.

## 2012-06-23 NOTE — Progress Notes (Signed)
Kevin Maker, MD PCP  The patient presents today for electrophysiology followup.  Since his last visit, the patient reports doing well.  He remains minimally symptomatic with his PVCs.  He underwent repeat PVC ablation by Dr Stephenie Acres at [facility] 03/08/12 though follow-up holter monitor reveals 47k PVCs in 24 hours.  His EF remains depressed.   Today, he denies symptoms of palpitations, chest pain, shortness of breath, orthopnea, PND, lower extremity edema,  presyncope, syncope, or neurologic sequela.  The patient feels that he is tolerating medications without difficulties and is otherwise without complaint today.   Past Medical History  Diagnosis Date  . Torn ACL   . DCM (dilated cardiomyopathy)     EF 37% by Echo 07/19/11  . PVC's (premature ventricular contractions)     s/p EPS and ablation 08/10/11 at [facility] and 03/08/12 at [facility]  . Hypertension   . ACL (anterior cruciate ligament) rupture, left 10/30/2011  . Acute medial meniscus tear of left knee 10/30/2011  . Acute lateral meniscus tear of left knee 10/30/2011   Past Surgical History  Procedure Date  . Ep study and radiofrequency ablation 08/10/11, 03/08/12    PVC ablation  . Wisdom tooth extraction   . Anterior cruciate ligament repair 10/30/2011    Procedure: RECONSTRUCTION ANTERIOR CRUCIATE LIGAMENT (ACL);  Surgeon: Eulas Post;  Location: [facility] OR;  Service: Orthopedics;;  . Menisectomy 10/30/2011    Procedure: MENISECTOMY;  Surgeon: Eulas Post;  Location: [facility] OR;  Service: Orthopedics;  Laterality: Left;  medial and lateral    Current Outpatient Prescriptions  Medication Sig Dispense Refill  . aspirin EC 81 MG tablet Take 81 mg by mouth daily.        . carvedilol (COREG) 6.25 MG tablet Take 6.25 mg by mouth 1 day or 1 dose. Patient taking one daily      . lisinopril (PRINIVIL,ZESTRIL) 10 MG tablet Take 1 tablet (10 mg total) by mouth daily.  30 tablet  6  . amiodarone (PACERONE) 200 MG tablet Take as  directed 400mg  twice daily for 2 weeks 200mg  twice daily for 2 weeks 200mg  daily  90 tablet  3    No Known Allergies  History   Social History  . Marital Status: Single    Spouse Name: N/A    Number of Children: N/A  . Years of Education: N/A   Occupational History  . furniture delivery    Social History Main Topics  . Smoking status: Never Smoker   . Smokeless tobacco: Never Used  . Alcohol Use: Yes     3 times per week  . Drug Use: Yes    Special: Marijuana, Cocaine     marijuana not currentlt using  . Sexually Active: Not on file   Other Topics Concern  . Not on file   Social History Narrative  . No narrative on file   Physical Exam: Filed Vitals:   06/20/12 1637  BP: 120/70  Pulse: 60  Height: 6\' 1"  (1.854 m)  Weight: 194 lb (87.998 kg)    GEN- The patient is well appearing, alert and oriented x 3 today.   Head- normocephalic, atraumatic Eyes-  Sclera clear, conjunctiva pink Ears- hearing intact Oropharynx- clear Neck- supple, no JVP Lymph- no cervical lymphadenopathy Lungs- Clear to ausculation bilaterally, normal work of breathing Heart- Regularly irregular rhythm with bigeminal ectopy, no murmurs, rubs or gallops, PMI not laterally displaced GI- soft, NT, ND, + BS Extremities- no clubbing, cyanosis, or  edema MS- no significant deformity or atrophy Skin- no rash or lesion Psych- euthymic mood, full affect Neuro- strength and sensation are intact  holter monitor and echo reviewed with patient today  Assessment and Plan:

## 2012-06-23 NOTE — Assessment & Plan Note (Signed)
Therapy for frequent PVCs has been difficult.  He is s/p ablation by me and subsequently by Dr Staci Righter (one of the field experts) but continues to have PVCs.  His recent holter revealed over 47K PVCs in 24 hours.  EF remains depressed, likely due to the PVCs. I spoke with Dr Staci Righter by phone today who recommends PVC suppression with amiodarone as the next step with hopes of EF normalization.  At that point, we could then consider transitioning to flecainide or a more aggressive ablative approach such as open thoracotomy.  I had a long discussion with Kevin Simpson and his father about this approach.  Risks, benefits, and alternatives to amiodarone therapy were discussed at length today.  They understand that risks include occular deposits/toxicity, hepatotoxicity, thyroid toxicity, pulmonary toxicity and event potentially death.  They accept these risks and wish to proceed. I will therefore initiate amidorone 400mg  BID x 2 weeks then 200mg  BID x 2 2weeks then 200mg  daily.  He will return in 4 weeks for LFTs/TFTs Hopefully we can suppress his PVCS with this regimen. The importance with compliance of follow-up was stressed today.

## 2012-08-01 ENCOUNTER — Telehealth: Payer: Self-pay | Admitting: Cardiology

## 2012-08-01 DIAGNOSIS — I493 Ventricular premature depolarization: Secondary | ICD-10-CM

## 2012-08-01 DIAGNOSIS — I420 Dilated cardiomyopathy: Secondary | ICD-10-CM

## 2012-08-01 MED ORDER — LISINOPRIL 10 MG PO TABS: 10.0000 mg | ORAL_TABLET | Freq: Every day | ORAL | Status: DC

## 2012-08-01 NOTE — Telephone Encounter (Signed)
lisinopril 10 mg refill needed, call to [facility] [place] @ [phone] pt out of town and needs called in asap

## 2012-09-09 ENCOUNTER — Telehealth: Payer: Self-pay | Admitting: Internal Medicine

## 2012-09-09 NOTE — Telephone Encounter (Signed)
New Problem:    Called in needing a refill of her son's carvedilol (COREG) 6.25 MG tablet and amiodarone (PACERONE) 200 MG tablet filled at the pharmacy on file.

## 2012-09-10 ENCOUNTER — Other Ambulatory Visit: Payer: Self-pay | Admitting: *Deleted

## 2012-09-10 MED ORDER — AMIODARONE HCL 200 MG PO TABS: 200.0000 mg | ORAL_TABLET | Freq: Every day | ORAL | Status: DC

## 2012-09-10 MED ORDER — CARVEDILOL 6.25 MG PO TABS: 6.2500 mg | ORAL_TABLET | Freq: Every day | ORAL | Status: DC

## 2012-09-16 ENCOUNTER — Other Ambulatory Visit: Payer: Self-pay | Admitting: Internal Medicine

## 2012-09-16 MED ORDER — CARVEDILOL 6.25 MG PO TABS: 6.2500 mg | ORAL_TABLET | Freq: Every day | ORAL | Status: DC

## 2012-10-11 ENCOUNTER — Other Ambulatory Visit: Payer: Self-pay | Admitting: *Deleted

## 2012-10-11 DIAGNOSIS — I420 Dilated cardiomyopathy: Secondary | ICD-10-CM

## 2012-10-11 DIAGNOSIS — I493 Ventricular premature depolarization: Secondary | ICD-10-CM

## 2012-10-11 MED ORDER — LISINOPRIL 10 MG PO TABS: 10.0000 mg | ORAL_TABLET | Freq: Every day | ORAL | Status: DC

## 2012-10-11 NOTE — Telephone Encounter (Signed)
Pt mother called to refill Lisinopril at [facility] on [address] for 1 month (30 days) while patient is here visiting.   Twilia Yaklin, CMA

## 2012-12-09 ENCOUNTER — Other Ambulatory Visit: Payer: Self-pay | Admitting: *Deleted

## 2012-12-09 MED ORDER — CARVEDILOL 6.25 MG PO TABS: 6.2500 mg | ORAL_TABLET | Freq: Every day | ORAL | Status: DC

## 2013-01-08 ENCOUNTER — Other Ambulatory Visit: Payer: Self-pay | Admitting: Cardiology

## 2013-01-08 DIAGNOSIS — I493 Ventricular premature depolarization: Secondary | ICD-10-CM

## 2013-01-08 DIAGNOSIS — I420 Dilated cardiomyopathy: Secondary | ICD-10-CM

## 2013-01-08 MED ORDER — LISINOPRIL 10 MG PO TABS: 10.0000 mg | ORAL_TABLET | Freq: Every day | ORAL | Status: DC

## 2013-01-08 MED ORDER — AMIODARONE HCL 200 MG PO TABS: 200.0000 mg | ORAL_TABLET | Freq: Every day | ORAL | Status: DC

## 2013-01-13 ENCOUNTER — Other Ambulatory Visit: Payer: Self-pay | Admitting: *Deleted

## 2013-01-13 DIAGNOSIS — I493 Ventricular premature depolarization: Secondary | ICD-10-CM

## 2013-01-13 DIAGNOSIS — I420 Dilated cardiomyopathy: Secondary | ICD-10-CM

## 2013-01-13 MED ORDER — LISINOPRIL 10 MG PO TABS: 10.0000 mg | ORAL_TABLET | Freq: Every day | ORAL | Status: DC

## 2013-03-17 ENCOUNTER — Other Ambulatory Visit: Payer: Self-pay | Admitting: Emergency Medicine

## 2013-03-17 MED ORDER — CARVEDILOL 6.25 MG PO TABS: 6.2500 mg | ORAL_TABLET | Freq: Every day | ORAL | Status: DC

## 2013-06-17 ENCOUNTER — Telehealth: Payer: Self-pay | Admitting: Internal Medicine

## 2013-06-17 NOTE — Telephone Encounter (Signed)
New Prob  Pt wants to know if there is anyway he he can be worked in on Monday. He said he is currently living in [place] and will be in town. He is currently on [place] schedule for 9.29 but he would like to be seen before then. He said that Dr Johney Frame has contacted his dad about him not being seen in a while.

## 2013-06-18 NOTE — Telephone Encounter (Signed)
Will add on to Dr Allred's schedule to [facility] at 12:30pm

## 2013-06-23 ENCOUNTER — Ambulatory Visit (HOSPITAL_COMMUNITY): Payer: BC Managed Care – PPO | Attending: Internal Medicine | Admitting: Radiology

## 2013-06-23 ENCOUNTER — Ambulatory Visit (INDEPENDENT_AMBULATORY_CARE_PROVIDER_SITE_OTHER): Payer: BC Managed Care – PPO | Admitting: Internal Medicine

## 2013-06-23 ENCOUNTER — Other Ambulatory Visit (HOSPITAL_COMMUNITY): Payer: Self-pay | Admitting: Cardiology

## 2013-06-23 VITALS — BP 118/78 | HR 56 | Ht 73.0 in | Wt 184.0 lb

## 2013-06-23 DIAGNOSIS — I079 Rheumatic tricuspid valve disease, unspecified: Secondary | ICD-10-CM | POA: Insufficient documentation

## 2013-06-23 DIAGNOSIS — I420 Dilated cardiomyopathy: Secondary | ICD-10-CM

## 2013-06-23 DIAGNOSIS — I428 Other cardiomyopathies: Secondary | ICD-10-CM | POA: Insufficient documentation

## 2013-06-23 DIAGNOSIS — I4949 Other premature depolarization: Secondary | ICD-10-CM

## 2013-06-23 DIAGNOSIS — I059 Rheumatic mitral valve disease, unspecified: Secondary | ICD-10-CM | POA: Insufficient documentation

## 2013-06-23 DIAGNOSIS — I25810 Atherosclerosis of coronary artery bypass graft(s) without angina pectoris: Secondary | ICD-10-CM

## 2013-06-23 DIAGNOSIS — I493 Ventricular premature depolarization: Secondary | ICD-10-CM

## 2013-06-23 LAB — HEPATIC FUNCTION PANEL
ALT: 17 U/L (ref 0–53)
Alkaline Phosphatase: 53 U/L (ref 39–117)
Bilirubin, Direct: 0.1 mg/dL (ref 0.0–0.3)
Total Protein: 7.8 g/dL (ref 6.0–8.3)

## 2013-06-23 LAB — BASIC METABOLIC PANEL
BUN: 15 mg/dL (ref 6–23)
Chloride: 103 mEq/L (ref 96–112)
GFR: 68.07 mL/min (ref >60.00)

## 2013-06-23 NOTE — Progress Notes (Signed)
Echocardiogram performed.  

## 2013-06-23 NOTE — Patient Instructions (Signed)
Your physician has requested that you have an echocardiogram. Echocardiography is a painless test that uses sound waves to create images of your heart. It provides your doctor with information about the size and shape of your heart and how well your heart's chambers and valves are working. This procedure takes approximately one hour. There are no restrictions for this procedure.    Your physician has recommended you make the following change in your medication:  1) Stop Amiodarone  Your physician recommends that you return for lab work today: BMP/liver/tsh/T4

## 2013-06-23 NOTE — Progress Notes (Signed)
Kevin Maker, MD PCP  The patient presents today for electrophysiology followup.  Since his last visit, the patient reports doing well.  He remains minimally symptomatic with his PVCs.  He underwent repeat PVC ablation by Dr Stephenie Acres at [facility] 03/08/12 though follow-up holter monitor reveals 47k PVCs in 24 hours.  He has not been seen by me in a year.  He now lives in [place] and only visits [place] occasionally. Today, he denies symptoms of palpitations, chest pain, shortness of breath, orthopnea, PND, lower extremity edema,  presyncope, syncope, or neurologic sequela.  The patient feels that he is tolerating medications without difficulties and is otherwise without complaint today.   Past Medical History  Diagnosis Date  . Torn ACL   . DCM (dilated cardiomyopathy)     EF 37% by Echo 07/19/11  . PVC's (premature ventricular contractions)     s/p EPS and ablation 08/10/11 at [facility] and 03/08/12 at [facility]  . Hypertension   . ACL (anterior cruciate ligament) rupture, left 10/30/2011  . Acute medial meniscus tear of left knee 10/30/2011  . Acute lateral meniscus tear of left knee 10/30/2011   Past Surgical History  Procedure Laterality Date  . Ep study and radiofrequency ablation  08/10/11, 03/08/12    PVC ablation  . Wisdom tooth extraction    . Anterior cruciate ligament repair  10/30/2011    Procedure: RECONSTRUCTION ANTERIOR CRUCIATE LIGAMENT (ACL);  Surgeon: Eulas Post;  Location: [facility] OR;  Service: Orthopedics;;  . Menisectomy  10/30/2011    Procedure: MENISECTOMY;  Surgeon: Eulas Post;  Location: [facility] OR;  Service: Orthopedics;  Laterality: Left;  medial and lateral    Current Outpatient Prescriptions  Medication Sig Dispense Refill  . amiodarone (PACERONE) 200 MG tablet Take 1 tablet (200 mg total) by mouth daily.  90 tablet  1  . aspirin EC 81 MG tablet Take 81 mg by mouth daily.        . carvedilol (COREG) 6.25 MG tablet Take 1 tablet (6.25 mg total) by  mouth daily.  90 tablet  3  . lisinopril (PRINIVIL,ZESTRIL) 10 MG tablet Take 1 tablet (10 mg total) by mouth daily.  90 tablet  2   No current facility-administered medications for this visit.    No Known Allergies  History   Social History  . Marital Status: Single    Spouse Name: N/A    Number of Children: N/A  . Years of Education: N/A   Occupational History  . furniture delivery    Social History Main Topics  . Smoking status: Never Smoker   . Smokeless tobacco: Never Used  . Alcohol Use: Yes     Comment: 3 times per week  . Drug Use: Yes    Special: Marijuana, Cocaine     Comment: marijuana not currentlt using  . Sexual Activity: Not on file   Other Topics Concern  . Not on file   Social History Narrative  . No narrative on file   Physical Exam: Filed Vitals:   06/23/13 1251  BP: 118/78  Pulse: 56  Height: 6\' 1"  (1.854 m)  Weight: 184 lb (83.462 kg)    GEN- The patient is well appearing, alert and oriented x 3 today.   Head- normocephalic, atraumatic Eyes-  Sclera clear, conjunctiva pink Ears- hearing intact Oropharynx- clear Neck- supple, no JVP Lymph- no cervical lymphadenopathy Lungs- Clear to ausculation bilaterally, normal work of breathing Heart- Regularly irregular rhythm with bigeminal ectopy, no  murmurs, rubs or gallops, PMI not laterally displaced GI- soft, NT, ND, + BS Extremities- no clubbing, cyanosis, or edema MS- no significant deformity or atrophy Skin- no rash or lesion Psych- euthymic mood, full affect Neuro- strength and sensation are intact  ekg today reveals sinus rhythm with PVCs in bigeminy.  PVCs are RBBB, LPFB pattern  Assessment and Plan:  1. PVCs The patient continues to have bigeminal PVCs depsite amiodarone therapy.  I think that given his young age it is best to stop amiodarone today.  I will obtain LFTs and TFTs today.  I had a long discussion with the patient and his father about future options.  They would like to  consider additional tertiary referral for ablation.  As he lives in [place], I think that referral to [facility]) to either Dr Sarajane Marek or Lavone Neri would be prudent. I will place a referral at this time.  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

## 2013-07-05 ENCOUNTER — Other Ambulatory Visit: Payer: Self-pay | Admitting: Internal Medicine

## 2013-07-14 ENCOUNTER — Ambulatory Visit: Payer: PRIVATE HEALTH INSURANCE | Admitting: Physician Assistant

## 2013-07-18 ENCOUNTER — Telehealth: Payer: Self-pay | Admitting: Internal Medicine

## 2013-07-18 NOTE — Telephone Encounter (Signed)
New problem    Status of referral - neuro in [place] .

## 2013-07-18 NOTE — Telephone Encounter (Signed)
Left a message to call back.

## 2013-07-21 NOTE — Telephone Encounter (Signed)
All Cardiac OV Note,Echo,12 Lead,Monitor Faxed to Nicole/Dr.[facility] Office at [phone] Call back [phone]

## 2013-07-24 ENCOUNTER — Telehealth: Payer: Self-pay | Admitting: Internal Medicine

## 2013-07-24 NOTE — Telephone Encounter (Signed)
30 Pages faxed to Nichole/Dr.[facility] Office [facility] at [phone]  07/24/13/KM

## 2013-07-24 NOTE — Telephone Encounter (Signed)
I have given father Dr Judi Cong number to ask about getting seen and procedure done this callander year

## 2013-07-24 NOTE — Telephone Encounter (Signed)
Status ck     Pt is following up the status of referral to a surgon in [place].  In reference to an Ablation to be done this year  2014 for Ins reasons.    Pt would like a call back ASAP.     Thank you!

## 2013-07-30 ENCOUNTER — Telehealth: Payer: Self-pay | Admitting: Internal Medicine

## 2013-07-30 NOTE — Telephone Encounter (Signed)
Joni Reining From [facility] Office Called to Let me know Pt Cancelled his appt With Serita Grammes his Father called and Made appt For him and His Father can No Do that so pt cancelled appt And said he will call back when he can Make appt that Fits him 07/30/13/KM

## 2013-09-05 ENCOUNTER — Telehealth: Payer: Self-pay | Admitting: Physician Assistant

## 2013-09-05 NOTE — Telephone Encounter (Signed)
Records were Received From [facility], no up Coming Appt gave to Scheduling Dept 09/05/13/Km

## 2013-09-08 ENCOUNTER — Other Ambulatory Visit: Payer: Self-pay | Admitting: *Deleted

## 2013-09-08 DIAGNOSIS — I493 Ventricular premature depolarization: Secondary | ICD-10-CM

## 2013-11-20 ENCOUNTER — Encounter: Payer: Self-pay | Admitting: Internal Medicine

## 2014-01-14 ENCOUNTER — Ambulatory Visit (INDEPENDENT_AMBULATORY_CARE_PROVIDER_SITE_OTHER): Payer: 59 | Admitting: Internal Medicine

## 2014-01-14 ENCOUNTER — Encounter: Payer: Self-pay | Admitting: Internal Medicine

## 2014-01-14 ENCOUNTER — Encounter (INDEPENDENT_AMBULATORY_CARE_PROVIDER_SITE_OTHER): Payer: 59

## 2014-01-14 ENCOUNTER — Encounter: Payer: Self-pay | Admitting: *Deleted

## 2014-01-14 VITALS — BP 125/75 | HR 64 | Ht 73.0 in | Wt 197.0 lb

## 2014-01-14 DIAGNOSIS — I420 Dilated cardiomyopathy: Secondary | ICD-10-CM

## 2014-01-14 DIAGNOSIS — I4949 Other premature depolarization: Secondary | ICD-10-CM

## 2014-01-14 DIAGNOSIS — I493 Ventricular premature depolarization: Secondary | ICD-10-CM

## 2014-01-14 DIAGNOSIS — I428 Other cardiomyopathies: Secondary | ICD-10-CM

## 2014-01-14 NOTE — Progress Notes (Signed)
PCP: Dan Maker, MD  Kevin Simpson is a 28 y.o. male who presents today for routine electrophysiology followup.  Since last being seen in our clinic, the patient reports doing very well.  He underwent ablation at [facility] 11/14.  This appears to have been successful.  He reports significant reduction in palpitations and improvement in energy.  Today, he denies symptoms of palpitations, chest pain, shortness of breath,  lower extremity edema, dizziness, presyncope, or syncope.  The patient is otherwise without complaint today.   Past Medical History  Diagnosis Date  . Torn ACL   . DCM (dilated cardiomyopathy)     EF 37% by Echo 07/19/11  . PVC's (premature ventricular contractions)     s/p EPS and ablation 08/10/11 at [facility] and 03/08/12 at [name], third ablation performed at [facility] by Dr Lavone Neri 11/14  . Hypertension   . ACL (anterior cruciate ligament) rupture, left 10/30/2011  . Acute medial meniscus tear of left knee 10/30/2011  . Acute lateral meniscus tear of left knee 10/30/2011   Past Surgical History  Procedure Laterality Date  . Ep study and radiofrequency ablation  08/10/11, 03/08/12, 09/03/13    PVC ablation  . Wisdom tooth extraction    . Anterior cruciate ligament repair  10/30/2011    Procedure: RECONSTRUCTION ANTERIOR CRUCIATE LIGAMENT (ACL);  Surgeon: Eulas Post;  Location: [facility] OR;  Service: Orthopedics;;  . Menisectomy  10/30/2011    Procedure: MENISECTOMY;  Surgeon: Eulas Post;  Location: [facility] OR;  Service: Orthopedics;  Laterality: Left;  medial and lateral    Current Outpatient Prescriptions  Medication Sig Dispense Refill  . carvedilol (COREG) 6.25 MG tablet Take 1 tablet (6.25 mg total) by mouth daily.  90 tablet  3  . lisinopril (PRINIVIL,ZESTRIL) 10 MG tablet Take 1 tablet (10 mg total) by mouth daily.  90 tablet  2   No current facility-administered medications for this visit.    Physical Exam: Filed Vitals:   01/14/14 1641  BP: 125/75  Pulse: 64  Height: 6\' 1"  (1.854 m)  Weight: 197 lb (89.359 kg)    GEN- The patient is well appearing, alert and oriented x 3 today.   Head- normocephalic, atraumatic Eyes-  Sclera clear, conjunctiva pink Ears- hearing intact Oropharynx- clear Lungs- Clear to ausculation bilaterally, normal work of breathing Heart- Regular rate and rhythm, no murmurs, rubs or gallops, PMI not laterally displaced GI- soft, NT, ND, + BS Extremities- no clubbing, cyanosis, or edema  ekg today reveals sinus rhythm 68 bpm, otherwise normal ekg  Assessment and Plan:  1. PVCs Successfully ablated at [facility] (third ablation attempt) 11/14.   24 hour holter ordered  2. Nonischemic CM Repeat echo IF EF has normalized with resolution of PVCs, we will stop coreg and lisinopril and repeat echo in 6 months If EF remains depressed then he will need to continue his current medical regimen  Return in 6 months

## 2014-01-14 NOTE — Progress Notes (Signed)
Patient ID: Kevin Simpson, male   DOB: 1986-06-22, 28 y.o.   MRN: [id number] E-Cardio 24 hour holter monitor applied to patient.

## 2014-01-14 NOTE — Patient Instructions (Signed)
Your physician wants you to follow-up in: 6 months with Dr Johney Frame with repeat echo You will receive a reminder letter in the mail two months in advance. If you don't receive a letter, please call our office to schedule the follow-up appointment.    Your physician has requested that you have an echocardiogram. Echocardiography is a painless test that uses sound waves to create images of your heart. It provides your doctor with information about the size and shape of your heart and how well your heart's chambers and valves are working. This procedure takes approximately one hour. There are no restrictions for this procedure.    Your physician has recommended that you wear a holter monitor. Holter monitors are medical devices that record the heart's electrical activity. Doctors most often use these monitors to diagnose arrhythmias. Arrhythmias are problems with the speed or rhythm of the heartbeat. The monitor is a small, portable device. You can wear one while you do your normal daily activities. This is usually used to diagnose what is causing palpitations/syncope (passing out).--24 hour holter

## 2014-01-19 ENCOUNTER — Ambulatory Visit (HOSPITAL_COMMUNITY): Payer: 59 | Attending: Internal Medicine | Admitting: Radiology

## 2014-01-19 DIAGNOSIS — I420 Dilated cardiomyopathy: Secondary | ICD-10-CM

## 2014-01-19 DIAGNOSIS — I4949 Other premature depolarization: Secondary | ICD-10-CM | POA: Insufficient documentation

## 2014-01-19 DIAGNOSIS — I493 Ventricular premature depolarization: Secondary | ICD-10-CM

## 2014-01-19 DIAGNOSIS — I428 Other cardiomyopathies: Secondary | ICD-10-CM | POA: Insufficient documentation

## 2014-01-19 NOTE — Progress Notes (Signed)
Echocardiogram performed.  

## 2014-02-03 ENCOUNTER — Other Ambulatory Visit: Payer: Self-pay | Admitting: Internal Medicine

## 2014-03-11 ENCOUNTER — Other Ambulatory Visit: Payer: Self-pay | Admitting: *Deleted

## 2014-03-11 MED ORDER — CARVEDILOL 6.25 MG PO TABS: 6.2500 mg | ORAL_TABLET | Freq: Every day | ORAL | Status: DC

## 2014-04-09 ENCOUNTER — Telehealth: Payer: Self-pay | Admitting: Internal Medicine

## 2014-04-09 NOTE — Telephone Encounter (Signed)
New Message:  Pt's father states his son will be in town tomorrow afternoon... Wants to know if there is any way Dr. Johney Frame can work him in.. States he saw Dr. Johney Frame last night at a ball game and was hoping he could get his son in tomorrow. Mr. Moskwa, the father, is requesting to speak w/ Tresa Endo.

## 2014-04-10 NOTE — Telephone Encounter (Signed)
Left a message for father that he is not due until Oct to see Dr Johney Frame

## 2014-04-13 ENCOUNTER — Telehealth: Payer: Self-pay | Admitting: Internal Medicine

## 2014-04-13 NOTE — Telephone Encounter (Signed)
Follow up      Pt returning RN call  Cell [phone]

## 2014-04-13 NOTE — Telephone Encounter (Addendum)
Has felt faint and had dizziness.  Outside in heat----door to door marketing  HR says will need a letter saying he was here to see Dr Johney Frame We offered for him to see Dr Miguel Rota tomorrow at 8:30 to discuss how he is feeling and how he will need to proceed with syptoms

## 2014-04-14 ENCOUNTER — Ambulatory Visit (INDEPENDENT_AMBULATORY_CARE_PROVIDER_SITE_OTHER): Payer: 59 | Admitting: Internal Medicine

## 2014-04-14 ENCOUNTER — Encounter: Payer: Self-pay | Admitting: Internal Medicine

## 2014-04-14 VITALS — BP 128/85 | HR 74 | Ht 73.0 in | Wt 202.0 lb

## 2014-04-14 DIAGNOSIS — I493 Ventricular premature depolarization: Secondary | ICD-10-CM

## 2014-04-14 DIAGNOSIS — R42 Dizziness and giddiness: Secondary | ICD-10-CM | POA: Insufficient documentation

## 2014-04-14 DIAGNOSIS — I428 Other cardiomyopathies: Secondary | ICD-10-CM

## 2014-04-14 DIAGNOSIS — I420 Dilated cardiomyopathy: Secondary | ICD-10-CM

## 2014-04-14 DIAGNOSIS — I4949 Other premature depolarization: Secondary | ICD-10-CM

## 2014-04-14 LAB — BASIC METABOLIC PANEL
BUN: 12 mg/dL (ref 6–23)
CALCIUM: 9.8 mg/dL (ref 8.4–10.5)
CO2: 28 mEq/L (ref 19–32)
Chloride: 104 mEq/L (ref 96–112)
Creatinine, Ser: 1.1 mg/dL (ref 0.4–1.5)
GFR: 83.91 mL/min (ref >60.00)
Glucose, Bld: 88 mg/dL (ref 70–99)
Potassium: 3.7 mEq/L (ref 3.5–5.1)
SODIUM: 138 meq/L (ref 135–145)

## 2014-04-14 LAB — CBC WITH DIFFERENTIAL/PLATELET
BASOS ABS: 0 10*3/uL (ref 0.0–0.1)
Basophils Relative: 0.6 % (ref 0.0–3.0)
Eosinophils Absolute: 0.1 10*3/uL (ref 0.0–0.7)
Eosinophils Relative: 1.5 % (ref 0.0–5.0)
HCT: 46.4 % (ref 39.0–52.0)
HEMOGLOBIN: 15.9 g/dL (ref 13.0–17.0)
LYMPHS PCT: 28.5 % (ref 12.0–46.0)
Lymphs Abs: 1.3 10*3/uL (ref 0.7–4.0)
MCHC: 34.2 g/dL (ref 30.0–36.0)
MCV: 87.1 fl (ref 78.0–100.0)
MONOS PCT: 10.8 % (ref 3.0–12.0)
Monocytes Absolute: 0.5 10*3/uL (ref 0.1–1.0)
Neutro Abs: 2.7 10*3/uL (ref 1.4–7.7)
Neutrophils Relative %: 58.6 % (ref 43.0–77.0)
Platelets: 299 10*3/uL (ref 150.0–400.0)
RBC: 5.32 Mil/uL (ref 4.22–5.81)
RDW: 12.4 % (ref 11.5–15.5)
WBC: 4.6 10*3/uL (ref 4.0–10.5)

## 2014-04-14 LAB — MAGNESIUM: Magnesium: 2 mg/dL (ref 1.5–2.5)

## 2014-04-14 LAB — TSH: TSH: 0.98 u[IU]/mL (ref 0.35–4.50)

## 2014-04-14 NOTE — Progress Notes (Signed)
PCP: Dan Maker, MD  Kevin Simpson is a 28 y.o. male who presents today for electrophysiology followup.  Since last being seen in our clinic, the patient reports doing very well.  His palpitations remain resolved. His concern today is with recent symptoms of dizziness.  This is primarily postural in nature and worse with quick change in position.  He has been working in the heat a lot this summer and is concerned about getting dehydrated.  Today, he denies symptoms of palpitations, chest pain, shortness of breath, lower extremity edema,  presyncope, or syncope.  The patient is otherwise without complaint today.   Past Medical History  Diagnosis Date  . Torn ACL   . DCM (dilated cardiomyopathy)     EF 37% by Echo 07/19/11  . PVC's (premature ventricular contractions)     s/p EPS and ablation 08/10/11 at [facility] and 03/08/12 at [facility], third ablation performed at [facility] by Dr Lavone Neri 11/14  . Hypertension   . ACL (anterior cruciate ligament) rupture, left 10/30/2011  . Acute medial meniscus tear of left knee 10/30/2011  . Acute lateral meniscus tear of left knee 10/30/2011   Past Surgical History  Procedure Laterality Date  . Ep study and radiofrequency ablation  08/10/11, 03/08/12, 09/03/13    PVC ablation  . Wisdom tooth extraction    . Anterior cruciate ligament repair  10/30/2011    Procedure: RECONSTRUCTION ANTERIOR CRUCIATE LIGAMENT (ACL);  Surgeon: Eulas Post;  Location: [facility] OR;  Service: Orthopedics;;  . Menisectomy  10/30/2011    Procedure: MENISECTOMY;  Surgeon: Eulas Post;  Location: [facility] OR;  Service: Orthopedics;  Laterality: Left;  medial and lateral    No current outpatient prescriptions on file.   No current facility-administered medications for this visit.   ROS- all systems are reviewed and negative except as per HPI above  Physical Exam: Filed Vitals:   04/14/14 0854  BP: 128/85  Pulse: 74  Height: 6\' 1"  (1.854  m)  Weight: 202 lb (91.627 kg)    GEN- The patient is well appearing, alert and oriented x 3 today.   Head- normocephalic, atraumatic Eyes-  Sclera clear, conjunctiva pink Ears- hearing intact Oropharynx- clear Lungs- Clear to ausculation bilaterally, normal work of breathing Heart- Regular rate and rhythm, no murmurs, rubs or gallops, PMI not laterally displaced GI- soft, NT, ND, + BS Extremities- no clubbing, cyanosis, or edema Skin- very prominent sun exposure with sun burn over face, arms,legs  ekg today reveals sinus rhythm 63 bpm, otherwise normal ekg, no pvcs   Assessment and Plan:  1. PVCs 24 hour holter reviewed with patient Appears resolved  2. Nonischemic CM Recent echo is reviewed with the patient today EF has normalized and he is no longer on ACE inhibitor/ beta blocker  3. Dizziness Likely due to dehydration/ sun exposure Adequate hydration and avoidance of heat/ sun are encouraged Bmet, mg, cbc, tfts today are ordered today  Return to see me in 6 months, sooner if symptoms do not improve

## 2014-04-14 NOTE — Patient Instructions (Addendum)
Labs today: BMET, CBC, TSH, Magnesium  Avoid the heat of the day  Maintain adequate hydration  Your physician wants you to follow-up in: 6 months with Dr. Johney Frame.  You will receive a reminder letter in the mail two months in advance. If you don't receive a letter, please call our office to schedule the follow-up appointment.

## 2015-02-08 ENCOUNTER — Encounter (HOSPITAL_BASED_OUTPATIENT_CLINIC_OR_DEPARTMENT_OTHER): Payer: Self-pay | Admitting: *Deleted

## 2015-02-08 ENCOUNTER — Emergency Department (HOSPITAL_BASED_OUTPATIENT_CLINIC_OR_DEPARTMENT_OTHER)
Admission: EM | Admit: 2015-02-08 | Discharge: 2015-02-08 | Disposition: A | Discharge status: Home / Self Care | Payer: Self-pay | Attending: Emergency Medicine | Admitting: Emergency Medicine

## 2015-02-08 ENCOUNTER — Emergency Department (HOSPITAL_BASED_OUTPATIENT_CLINIC_OR_DEPARTMENT_OTHER): Payer: Self-pay

## 2015-02-08 DIAGNOSIS — Y9367 Activity, basketball: Secondary | ICD-10-CM | POA: Insufficient documentation

## 2015-02-08 DIAGNOSIS — X58XXXA Exposure to other specified factors, initial encounter: Secondary | ICD-10-CM | POA: Insufficient documentation

## 2015-02-08 DIAGNOSIS — Y998 Other external cause status: Secondary | ICD-10-CM | POA: Insufficient documentation

## 2015-02-08 DIAGNOSIS — S8262XA Displaced fracture of lateral malleolus of left fibula, initial encounter for closed fracture: Secondary | ICD-10-CM | POA: Insufficient documentation

## 2015-02-08 DIAGNOSIS — Y92310 Basketball court as the place of occurrence of the external cause: Secondary | ICD-10-CM | POA: Insufficient documentation

## 2015-02-08 DIAGNOSIS — I10 Essential (primary) hypertension: Secondary | ICD-10-CM | POA: Insufficient documentation

## 2015-02-08 MED ORDER — HYDROCODONE-ACETAMINOPHEN 5-325 MG PO TABS
2.0000 | ORAL_TABLET | Freq: Once | ORAL | Status: AC
Start: 2015-02-08 — End: 2015-02-08
  Administered 2015-02-08: 2 via ORAL
  Filled 2015-02-08: qty 2

## 2015-02-08 MED ORDER — HYDROCODONE-ACETAMINOPHEN 5-325 MG PO TABS: 1.0000 | ORAL_TABLET | Freq: Four times a day (QID) | ORAL | Status: DC | PRN

## 2015-02-08 NOTE — ED Notes (Signed)
Playing basketball and twisted his left ankle. Ice and ace on arrival.

## 2015-02-08 NOTE — ED Provider Notes (Signed)
CSN: [id number]     Arrival date & time 02/08/15  1853 History  This chart was scribed for Geoffery Lyons, MD by Ronney Lion, ED Scribe. This patient was seen in room MHFT1/MHFT1 and the patient's care was started at 8:43 PM.    Chief Complaint  Patient presents with  . Ankle Injury   Patient is a 29 y.o. male presenting with lower extremity injury. The history is provided by the patient. No language interpreter was used.  Ankle Injury This is a new problem. The current episode started 1 to 2 hours ago. The problem occurs rarely. The problem has not changed since onset.Pertinent negatives include no chest pain, no abdominal pain, no headaches and no shortness of breath. Nothing aggravates the symptoms. Nothing relieves the symptoms. He has tried nothing for the symptoms.    HPI Comments: Kevin Simpson is a 29 y.o. male who presents to the Emergency Department complaining of constant, moderate left ankle pain following a left ankle injury that occurred PTA when patient was playing basketball and rolled his ankle forward. He denies a history of prior left ankle injury, although he notes he has torn several ligaments in his left leg in the past.   Past Medical History  Diagnosis Date  . Torn ACL   . DCM (dilated cardiomyopathy)     EF 37% by Echo 07/19/11  . PVC's (premature ventricular contractions)     s/p EPS and ablation 08/10/11 at [facility] and 03/08/12 at [facility], third ablation performed at [facility] by Dr Lavone Neri 11/14  . Hypertension   . ACL (anterior cruciate ligament) rupture, left 10/30/2011  . Acute medial meniscus tear of left knee 10/30/2011  . Acute lateral meniscus tear of left knee 10/30/2011   Past Surgical History  Procedure Laterality Date  . Ep study and radiofrequency ablation  08/10/11, 03/08/12, 09/03/13    PVC ablation  . Wisdom tooth extraction    . Anterior cruciate ligament repair  10/30/2011    Procedure: RECONSTRUCTION  ANTERIOR CRUCIATE LIGAMENT (ACL);  Surgeon: Eulas Post;  Location: [facility] OR;  Service: Orthopedics;;  . Menisectomy  10/30/2011    Procedure: MENISECTOMY;  Surgeon: Eulas Post;  Location: [facility] OR;  Service: Orthopedics;  Laterality: Left;  medial and lateral   No family history on file. History  Substance Use Topics  . Smoking status: Never Smoker   . Smokeless tobacco: Never Used  . Alcohol Use: Yes     Comment: 3 times per week    Review of Systems  Respiratory: Negative for shortness of breath.   Cardiovascular: Negative for chest pain.  Gastrointestinal: Negative for abdominal pain.  Musculoskeletal: Positive for arthralgias.  Neurological: Negative for headaches.  All other systems reviewed and are negative.    Allergies  Review of patient's allergies indicates no known allergies.  Home Medications   Prior to Admission medications   Not on File   BP 134/86 mmHg  Pulse 73  Temp(Src) 98.4 F (36.9 C) (Oral)  Resp 18  Ht  (1.854 m)  Wt 200 lb (90.719 kg)  BMI 26.39 kg/m2  SpO2 100% Physical Exam  Constitutional: He is oriented to person, place, and time. He appears well-developed and well-nourished. No distress.  HENT:  Head: Normocephalic and atraumatic.  Eyes: Conjunctivae and EOM are normal.  Neck: Neck supple. No tracheal deviation present.  Cardiovascular: Normal rate.   Pulmonary/Chest: Effort normal. No respiratory distress.  Musculoskeletal: Normal range of  motion. He exhibits edema and tenderness.  There is swelling and tenderness over the left lateral malleolus.   Neurological: He is alert and oriented to person, place, and time.  Skin: Skin is warm and dry.  Psychiatric: He has a normal mood and affect. His behavior is normal.  Nursing note and vitals reviewed.   ED Course  Procedures (including critical care time)  DIAGNOSTIC STUDIES: Oxygen Saturation is 100% on RA, normal by my interpretation.    COORDINATION OF CARE: 8:44 PM -  Discussed XR results and treatment plan with pt at bedside which includes RICE protocol, boot (CAM walker), and f/u with orthopedics, and pt agreed to plan.   Imaging Review Dg Ankle Complete Left  02/08/2015   CLINICAL DATA:  Rolled left ankle playing basketball, medial and lateral pain with swelling  EXAM: LEFT ANKLE COMPLETE - 3+ VIEW  COMPARISON:  None.  FINDINGS: Nondisplaced fracture involving the inferior aspect of the lateral malleolus.  Overlying soft tissue swelling.  The ankle mortise is intact.  The base of the fifth metatarsal is unremarkable.  IMPRESSION: Nondisplaced fracture involving the inferior aspect of the lateral malleolus.   Electronically Signed   By: Charline Bills M.D.   On: 02/08/2015 19:50    MDM   Final diagnoses:  None   Xrays show an avulsion fracture of the distal fibula.  He will placed in an immobilizer and follow up with orthopedics.    Geoffery Lyons, MD 02/09/15 2000

## 2015-02-08 NOTE — Discharge Instructions (Signed)
Wear cam walker with no weightbearing until followed up by orthopedics.  Hydrocodone as prescribed as needed for pain.  Elevate your leg and ice it for 20 minutes every 2 hours while awake for the next 2 days.  Follow-up with orthopedic surgery later this week. The contact information for Dr. Magnus Ivan has been provided in this discharge summary. Call to arrange this appointment.   Ankle Fracture A fracture is a break in a bone. The ankle joint is made up of three bones. These include the lower (distal)sections of your lower leg bones, called the tibia and fibula, along with a bone in your foot, called the talus. Depending on how bad the break is and if more than one ankle joint bone is broken, a cast or splint is used to protect and keep your injured bone from moving while it heals. Sometimes, surgery is required to help the fracture heal properly.  There are two general types of fractures:  Stable fracture. This includes a single fracture line through one bone, with no injury to ankle ligaments. A fracture of the talus that does not have any displacement (movement of the bone on either side of the fracture line) is also stable.  Unstable fracture. This includes more than one fracture line through one or more bones in the ankle joint. It also includes fractures that have displacement of the bone on either side of the fracture line. CAUSES  A direct blow to the ankle.   Quickly and severely twisting your ankle.  Trauma, such as a car accident or falling from a significant height. RISK FACTORS You may be at a higher risk of ankle fracture if:  You have certain medical conditions.  You are involved in high-impact sports.  You are involved in a high-impact car accident. SIGNS AND SYMPTOMS   Tender and swollen ankle.  Bruising around the injured ankle.  Pain on movement of the ankle.  Difficulty walking or putting weight on the ankle.  A cold foot below the site of the ankle  injury. This can occur if the blood vessels passing through your injured ankle were also damaged.  Numbness in the foot below the site of the ankle injury. DIAGNOSIS  An ankle fracture is usually diagnosed with a physical exam and X-rays. A CT scan may also be required for complex fractures. TREATMENT  Stable fractures are treated with a cast or splint and using crutches to avoid putting weight on your injured ankle. This is followed by an ankle strengthening program. Some patients require a special type of cast, depending on other medical problems they may have. Unstable fractures require surgery to ensure the bones heal properly. Your health care provider will tell you what type of fracture you have and the best treatment for your condition. HOME CARE INSTRUCTIONS   Review correct crutch use with your health care provider and use your crutches as directed. Safe use of crutches is extremely important. Misuse of crutches can cause you to fall or cause injury to nerves in your hands or armpits.  Do not put weight or pressure on the injured ankle until directed by your health care provider.  To lessen the swelling, keep the injured leg elevated while sitting or lying down.  Apply ice to the injured area:  Put ice in a plastic bag.  Place a towel between your cast and the bag.  Leave the ice on for 20 minutes, 2-3 times a day.  If you have a plaster or fiberglass cast:  Do not try to scratch the skin under the cast with any objects. This can increase your risk of skin infection.  Check the skin around the cast every day. You may put lotion on any red or sore areas.  Keep your cast dry and clean.  If you have a plaster splint:  Wear the splint as directed.  You may loosen the elastic around the splint if your toes become numb, tingle, or turn cold or blue.  Do not put pressure on any part of your cast or splint; it may break. Rest your cast only on a pillow the first 24 hours until  it is fully hardened.  Your cast or splint can be protected during bathing with a plastic bag sealed to your skin with medical tape. Do not lower the cast or splint into water.  Take medicines as directed by your health care provider. Only take over-the-counter or prescription medicines for pain, discomfort, or fever as directed by your health care provider.  Do not drive a vehicle until your health care provider specifically tells you it is safe to do so.  If your health care provider has given you a follow-up appointment, it is very important to keep that appointment. Not keeping the appointment could result in a chronic or permanent injury, pain, and disability. If you have any problem keeping the appointment, call the facility for assistance. SEEK MEDICAL CARE IF: You develop increased swelling or discomfort. SEEK IMMEDIATE MEDICAL CARE IF:   Your cast gets damaged or breaks.  You have continued severe pain.  You develop new pain or swelling after the cast was put on.  Your skin or toenails below the injury turn blue or gray.  Your skin or toenails below the injury feel cold, numb, or have loss of sensitivity to touch.  There is a bad smell or pus draining from under the cast. MAKE SURE YOU:   Understand these instructions.  Will watch your condition.  Will get help right away if you are not doing well or get worse. Document Released: 09/29/2000 Document Revised: 10/07/2013 Document Reviewed: 05/01/2013 [facility] Patient Information 2015 [place], [place]. This information is not intended to replace advice given to you by your health care provider. Make sure you discuss any questions you have with your health care provider.

## 2016-04-22 IMAGING — DX DG ANKLE COMPLETE 3+V*L*
3 series · 3 of 3 positions shown · non-contrast
Comparison: None.

CLINICAL DATA: Rolled left ankle playing basketball, medial and
lateral pain with swelling

EXAM:
LEFT ANKLE COMPLETE - 3+ VIEW

[ankle ap]
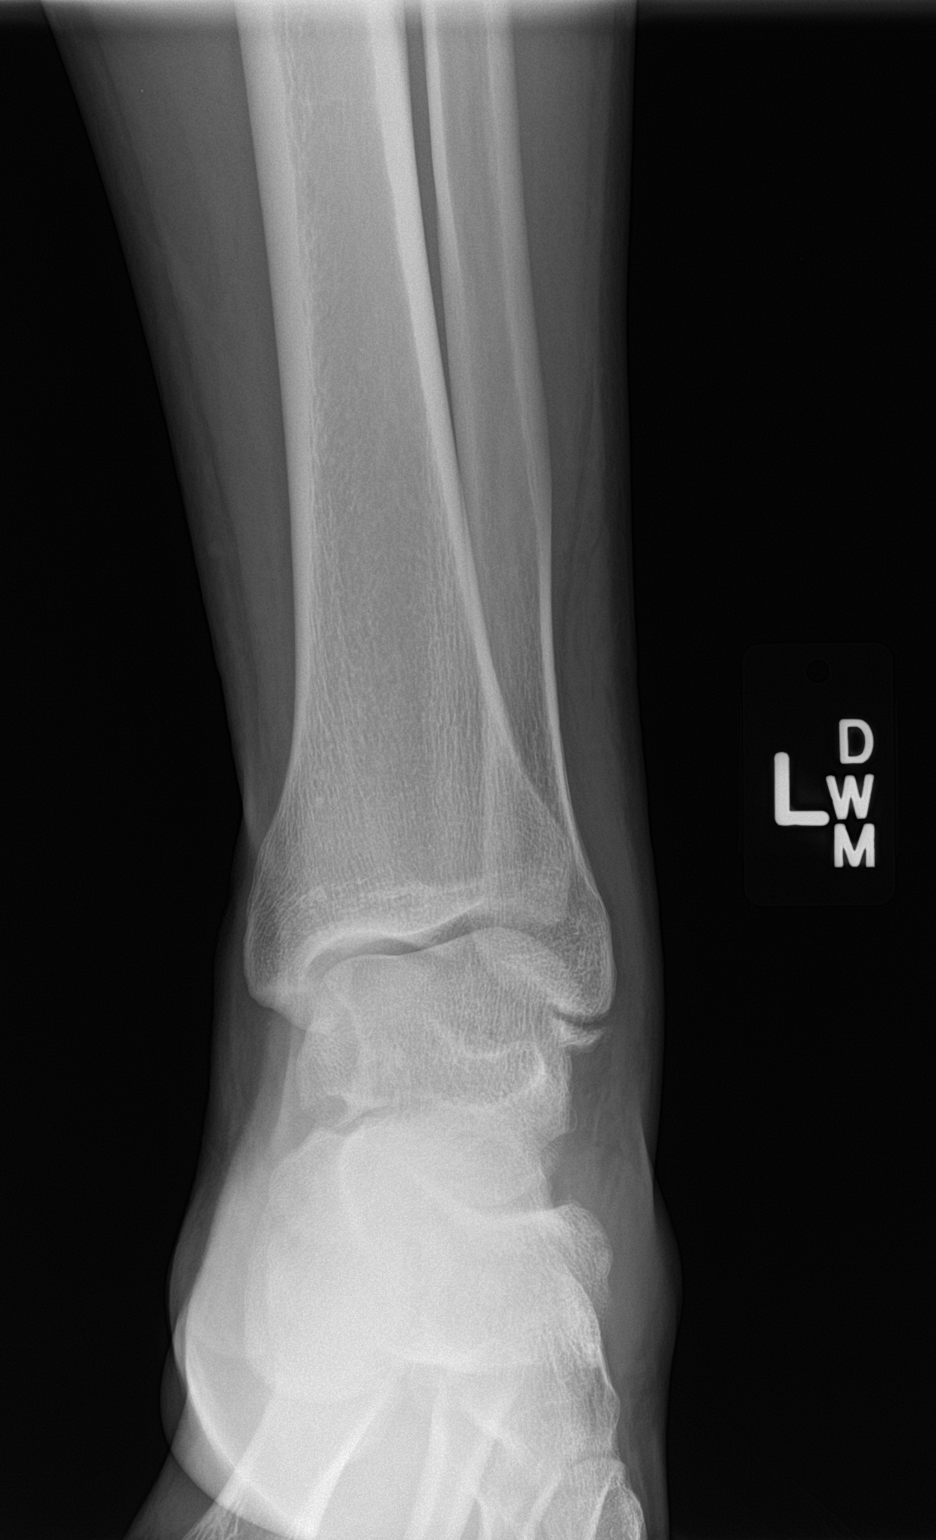

[ankle obl]
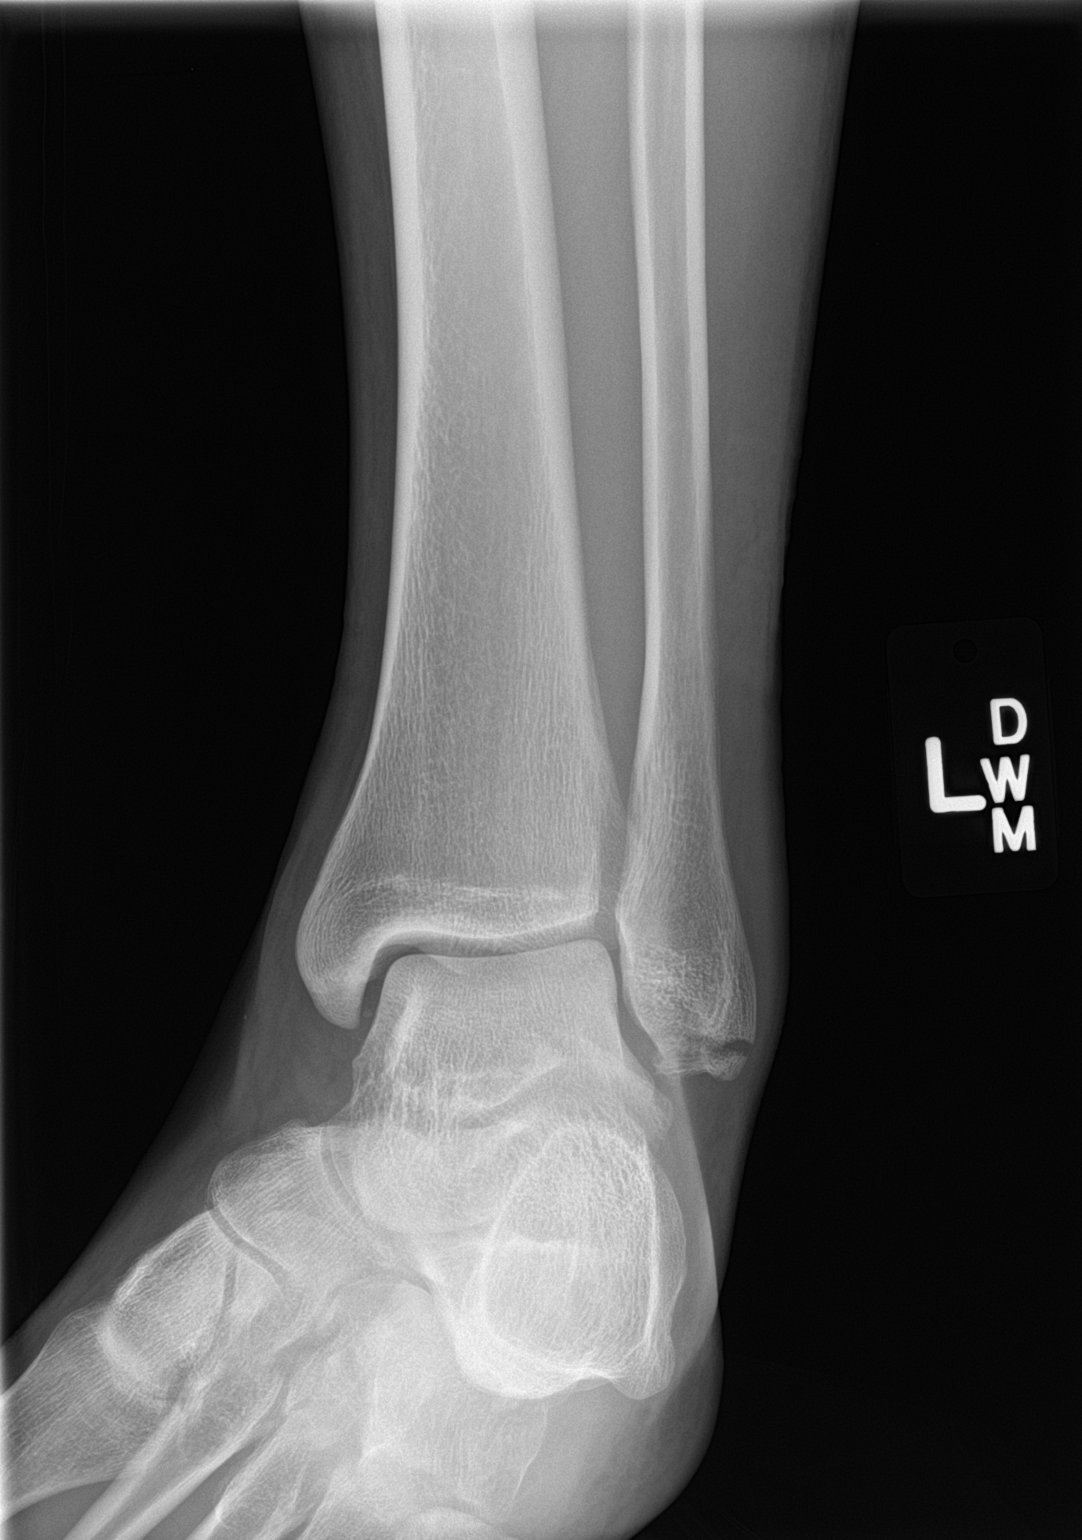

[ankle lat]
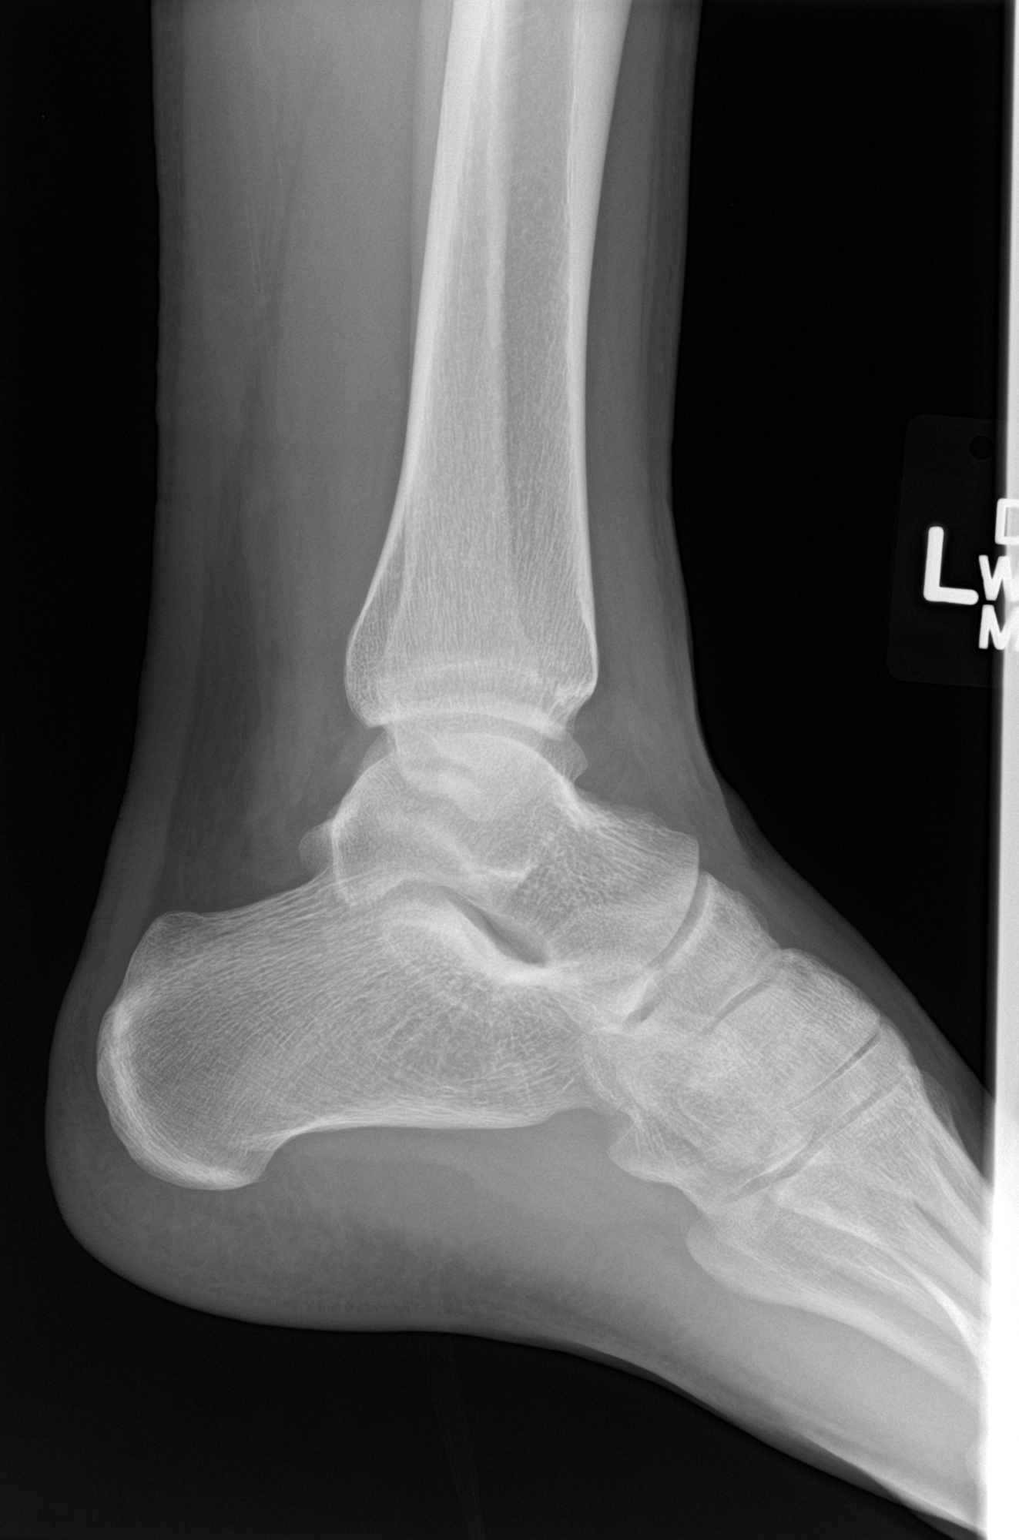

[3 of 3 positions shown; findings below may reference images not displayed]

FINDINGS: Nondisplaced fracture involving the inferior aspect of the lateral
malleolus.

Overlying soft tissue swelling.

The ankle mortise is intact.

The base of the fifth metatarsal is unremarkable.
IMPRESSION: Nondisplaced fracture involving the inferior aspect of the lateral
malleolus.

## 2018-02-20 ENCOUNTER — Telehealth: Payer: Self-pay | Admitting: Internal Medicine

## 2018-02-20 NOTE — Telephone Encounter (Signed)
Returned call to Pt.  Pt last seen by Dr. Johney Frame in 2015.  Since that time Pt's palpitations have been largely silent, has not had any further treatment for PVC's.  Pt calling at this time d/t increased palpitations.  Also having some chest tightness and left arm tightness.   First available for Dr. Johney Frame is May 24.  Discussed with Pt.  Pt would like to be seen sooner, willing to see APP. Gave Pt to Melissa to set up appointment, will follow up after APP visit.

## 2018-02-20 NOTE — Telephone Encounter (Signed)
Pt to see APP 02/25/2018 at 2:00 pm. Will cont to follow.

## 2018-02-20 NOTE — Telephone Encounter (Signed)
Pt was sent to my voicemail thinking he was calling Tresa Endo, pt having issues with PVC's again, and wants to be seen asap. Last visit 04-14-2014. Hx Ablation with [facility] about 2012 which failed, [facility] referred him to [facility](he does not remember who this doctor was) for another Ablation around 2013 which also failed, he was then referred by the Doctor in [place] to Dr. Mliss Sax at [facility] in [place], [place]. I have request those records by fax. Pt requesting call @ [phone].

## 2018-02-25 ENCOUNTER — Encounter (INDEPENDENT_AMBULATORY_CARE_PROVIDER_SITE_OTHER): Payer: Self-pay

## 2018-02-25 ENCOUNTER — Encounter: Payer: Self-pay | Admitting: Internal Medicine

## 2018-02-25 ENCOUNTER — Ambulatory Visit (INDEPENDENT_AMBULATORY_CARE_PROVIDER_SITE_OTHER): Payer: Self-pay | Admitting: Internal Medicine

## 2018-02-25 VITALS — BP 122/64 | HR 69 | Ht 73.0 in | Wt 230.0 lb

## 2018-02-25 DIAGNOSIS — I493 Ventricular premature depolarization: Secondary | ICD-10-CM

## 2018-02-25 DIAGNOSIS — R079 Chest pain, unspecified: Secondary | ICD-10-CM

## 2018-02-25 DIAGNOSIS — R0602 Shortness of breath: Secondary | ICD-10-CM

## 2018-02-25 NOTE — Patient Instructions (Addendum)
Medication Instructions:  Your physician recommends that you continue on your current medications as directed. Please refer to the Current Medication list given to you today.  Labwork: Please get lab work on a day coming in for a cardiac test. Needs fasting lipids, A1C, BMET, CBC and TSH  Testing/Procedures: Your physician has requested that you have an echocardiogram. Echocardiography is a painless test that uses sound waves to create images of your heart. It provides your doctor with information about the size and shape of your heart and how well your heart's chambers and valves are working. This procedure takes approximately one hour. There are no restrictions for this procedure.  Schedule for ECHO.  Your physician has requested that you have an exercise tolerance test. For further information please visit [email or web address] Please also follow instruction sheet, as given.  Schedule for an exercise tolerance test.  Your physician has recommended that you wear a holter monitor. Holter monitors are medical devices that record the heart's electrical activity. Doctors most often use these monitors to diagnose arrhythmias. Arrhythmias are problems with the speed or rhythm of the heartbeat. The monitor is a small, portable device. You can wear one while you do your normal daily activities. This is usually used to diagnose what is causing palpitations/syncope (passing out).  Please schedule for 24 hour holter monitor.  Follow-Up: Your physician wants you to follow-up in: 4 weeks with Dr. Johney Frame.      Any Other Special Instructions Will Be Listed Below (If Applicable).  If you need a refill on your cardiac medications before your next appointment, please call your pharmacy.

## 2018-02-25 NOTE — Telephone Encounter (Signed)
Pt to see Dr. Johney Frame today.  NO further action needed on phone encounter.

## 2018-02-25 NOTE — Progress Notes (Signed)
Electrophysiology Office Note   Date:  02/25/2018   ID:  Kevin Simpson, DOB 11-Mar-1986, MRN [id number]  PCP:  Dan Maker., MD    Primary Electrophysiologist: [name], MD    CC: palpitations   History of Present Illness: Kevin Simpson is a 32 y.o. male who presents today for electrophysiology evaluation.   He presents after doing well since 02-15-14.  He reports that over the past year or so he has not been diligent with lifestyle modification.  He is working and very busy as an Nutritional therapist.  He has a 38 yo son who lives with him.   His brother died of GSW in 16-Feb-2015.  He has had some difficulty dealing with this.  He has had issues with ETOH use, narcotics, and even cocaine.  He recognizes this to be problematic and has been working to quit.  He is not exercising and has also gained substantial weight. He has occasional palpitations.  He also has noticed L chest wall and shoulder pain as well as SOB.  He is very anxious/ concerned about his health and presents today for further evaluation.  Today, he denies symptoms of  orthopnea, PND, lower extremity edema, claudication, dizziness, presyncope, syncope, bleeding, or neurologic sequela. The patient is tolerating medications without difficulties and is otherwise without complaint today.    Past Medical History:  Diagnosis Date  . ACL (anterior cruciate ligament) rupture, left 10/30/2011  . Acute lateral meniscus tear of left knee 10/30/2011  . Acute medial meniscus tear of left knee 10/30/2011  . DCM (dilated cardiomyopathy) (HCC)    EF 37% by Echo 07/19/11  . Hypertension   . PVC's (premature ventricular contractions)    s/p EPS and ablation 08/10/11 at [facility] and 03/08/12 at [facility], third ablation performed at [facility] by Dr Lavone Neri 11/14  . Torn ACL    Past Surgical History:  Procedure Laterality Date  . ANTERIOR CRUCIATE LIGAMENT REPAIR  10/30/2011   Procedure:  RECONSTRUCTION ANTERIOR CRUCIATE LIGAMENT (ACL);  Surgeon: Eulas Post;  Location: [facility] OR;  Service: Orthopedics;;  . EP study and radiofrequency ablation  08/10/11, 03/08/12, 09/03/13   PVC ablation  . MENISECTOMY  10/30/2011   Procedure: MENISECTOMY;  Surgeon: Eulas Post;  Location: [facility] OR;  Service: Orthopedics;  Laterality: Left;  medial and lateral  . WISDOM TOOTH EXTRACTION       No current outpatient medications on file.   No current facility-administered medications for this visit.     Allergies:   Patient has no known allergies.   Social History:  The patient  reports that he has never smoked. He has never used smokeless tobacco. He reports that he drinks alcohol. He reports that he has current or past drug history. Drugs: Marijuana and Cocaine.   Family History:  The patient's FH is notable for HTN ,  Brother died of GSW at age 74.   ROS:  Please see the history of present illness.   All other systems are personally reviewed and negative.    PHYSICAL EXAM: VS:  BP 122/64   Pulse 69   Ht 6\' 1"  (1.854 m)   Wt 230 lb (104.3 kg)   BMI 30.34 kg/m  , BMI Body mass index is 30.34 kg/m. GEN: Well nourished, well developed, in no acute distress  HEENT: normal  Neck: no JVD, carotid bruits, or masses Cardiac: RRR; no murmurs, rubs, or gallops,no edema  Respiratory:  clear  to auscultation bilaterally, normal work of breathing GI: soft, nontender, nondistended, + BS MS: no deformity or atrophy  Skin: warm and dry  Neuro:  Strength and sensation are intact Psych: euthymic mood, full affect  EKG:  EKG is ordered today. The ekg ordered today is personally reviewed and shows sinus rhythm 69 bpm, nonspecific St/T Changes   Recent Labs: No results found for requested labs within last 8760 hours.  personally reviewed   Lipid Panel  No results found for: CHOL, TRIG, HDL, CHOLHDL, VLDL, LDLCALC, LDLDIRECT personally reviewed   Wt Readings from Last 3 Encounters:    02/25/18 230 lb (104.3 kg)  02/08/15 200 lb (90.7 kg)  04/14/14 202 lb (91.6 kg)      Other studies personally reviewed: Additional studies/ records that were reviewed today include: echo 2015   ASSESSMENT AND PLAN:  1.  PVCs Appears to remain resolved holter monitor  2. Shortness of breath, palpitations, chest pain Chest pain is atypical.  I suspect that his symptoms are primarily due to anxiety. Given prior NICM, I think that further CV assessment is advised. I will therefore order 24 hour holter, echo, and ETT to assess symptoms Cocaine, ETOH avoidance is encouraged Further lifestyle modification pending results of above Tsh, A1C, bmet, CBC, and fasting lipids are also ordered  3. Overweight Body mass index is 30.34 kg/m. Lifestyle modification encouraged Consider sleep study if above is normal  4. Polysubstance abuse/ ETOH Cessation strongly advised   Follow-up:  Return in 4 weeks to discuss results of above  Current medicines are reviewed at length with the patient today.   The patient does not have concerns regarding his medicines.  The following changes were made today:  none  Labs/ tests ordered today include:  Orders Placed This Encounter  Procedures  . EKG 12-Lead     Signed, [name], MD  02/25/2018 3:29 PM     [facility] [address] [place] [address] [phone] (office) [phone] (fax)

## 2018-03-12 ENCOUNTER — Ambulatory Visit (HOSPITAL_COMMUNITY): Payer: Self-pay | Attending: Cardiology

## 2018-03-12 ENCOUNTER — Ambulatory Visit (INDEPENDENT_AMBULATORY_CARE_PROVIDER_SITE_OTHER): Payer: Self-pay

## 2018-03-12 ENCOUNTER — Other Ambulatory Visit: Payer: Self-pay

## 2018-03-12 ENCOUNTER — Other Ambulatory Visit: Payer: Self-pay | Admitting: *Deleted

## 2018-03-12 DIAGNOSIS — I493 Ventricular premature depolarization: Secondary | ICD-10-CM

## 2018-03-12 DIAGNOSIS — R079 Chest pain, unspecified: Secondary | ICD-10-CM | POA: Insufficient documentation

## 2018-03-12 DIAGNOSIS — I340 Nonrheumatic mitral (valve) insufficiency: Secondary | ICD-10-CM | POA: Insufficient documentation

## 2018-03-12 DIAGNOSIS — R0602 Shortness of breath: Secondary | ICD-10-CM | POA: Insufficient documentation

## 2018-03-12 LAB — EXERCISE TOLERANCE TEST
Estimated workload: 11.9 METS
Exercise duration (min): 10 min
Exercise duration (sec): 8 s
MPHR: 190 {beats}/min
Peak HR: 169 {beats}/min
Percent HR: 88 %
RPE: 17
Rest HR: 59 {beats}/min

## 2018-03-13 LAB — CBC WITH DIFFERENTIAL/PLATELET
Basophils Absolute: 0 x10E3/uL (ref 0.0–0.2)
Basos: 1 %
EOS (ABSOLUTE): 0.1 x10E3/uL (ref 0.0–0.4)
Eos: 2 %
Hematocrit: 44.7 % (ref 37.5–51.0)
Hemoglobin: 15.5 g/dL (ref 13.0–17.7)
Immature Grans (Abs): 0 x10E3/uL (ref 0.0–0.1)
Immature Granulocytes: 1 %
Lymphocytes Absolute: 1.2 x10E3/uL (ref 0.7–3.1)
Lymphs: 31 %
MCH: 30.2 pg (ref 26.6–33.0)
MCHC: 34.7 g/dL (ref 31.5–35.7)
MCV: 87 fL (ref 79–97)
Monocytes Absolute: 0.4 x10E3/uL (ref 0.1–0.9)
Monocytes: 10 %
Neutrophils Absolute: 2.2 x10E3/uL (ref 1.4–7.0)
Neutrophils: 55 %
Platelets: 306 x10E3/uL (ref 150–450)
RBC: 5.14 x10E6/uL (ref 4.14–5.80)
RDW: 12.7 % (ref 12.3–15.4)
WBC: 4 x10E3/uL (ref 3.4–10.8)

## 2018-03-13 LAB — HEMOGLOBIN A1C
Est. average glucose Bld gHb Est-mCnc: 103 mg/dL
Hgb A1c MFr Bld: 5.2 % (ref 4.8–5.6)

## 2018-03-13 LAB — LIPID PANEL
Chol/HDL Ratio: 8.8 ratio — ABNORMAL HIGH (ref 0.0–5.0)
Cholesterol, Total: 283 mg/dL — ABNORMAL HIGH (ref 100–199)
HDL: 32 mg/dL — ABNORMAL LOW
LDL Calculated: 180 mg/dL — ABNORMAL HIGH (ref 0–99)
Triglycerides: 353 mg/dL — ABNORMAL HIGH (ref 0–149)
VLDL Cholesterol Cal: 71 mg/dL — ABNORMAL HIGH (ref 5–40)

## 2018-03-13 LAB — TSH: TSH: 1.79 u[IU]/mL (ref 0.450–4.500)

## 2018-03-27 ENCOUNTER — Ambulatory Visit: Payer: Self-pay | Admitting: Internal Medicine

## 2018-03-27 ENCOUNTER — Ambulatory Visit: Payer: Self-pay

## 2018-03-27 NOTE — Progress Notes (Deleted)
Patient ID: Kevin Simpson                 DOB: May 04, 1986                    MRN: [id number]     HPI: Kevin Simpson is a 32 y.o. male patient of Dr. Johney Simpson that presents today for lipid evaluation.  PMH includes PVCs, HTN, and nonischemic CM. His ASCVD 10-year risk is estimated at about 3.9% with optimal risk of 0.6%. He presents today for discussion of cholesterol  Risk Factors: HTN,  LDL Goal: <100, nonHDL <130  Current Medications:  Intolerances:   Diet:   Exercise:   Family History:   Social History: denies tobacco products, drinks alcohol 3 times per week  Labs: 03/12/18:  TC 283, TG 353, HDL 32, LDL 180, nonHDL 251 (no therapy)  Past Medical History:  Diagnosis Date  . ACL (anterior cruciate ligament) rupture, left 10/30/2011  . Acute lateral meniscus tear of left knee 10/30/2011  . Acute medial meniscus tear of left knee 10/30/2011  . DCM (dilated cardiomyopathy) (HCC)    EF 37% by Echo 07/19/11  . Hypertension   . PVC's (premature ventricular contractions)    s/p EPS and ablation 08/10/11 at [facility] and 03/08/12 at [facility], third ablation performed at [facility] by Dr Kevin Simpson 11/14  . Torn ACL     No current outpatient medications on file prior to visit.   No current facility-administered medications on file prior to visit.     No Known Allergies  Assessment/Plan: Hyperlipidemia:    Thank you,  Kevin Simpson. Kevin Simpson, PharmD  [facility]  03/27/2018 7:51 AM

## 2018-03-28 ENCOUNTER — Encounter: Payer: Self-pay | Admitting: Internal Medicine

## 2018-04-15 ENCOUNTER — Encounter: Payer: Self-pay | Admitting: Internal Medicine

## 2018-04-24 ENCOUNTER — Ambulatory Visit (INDEPENDENT_AMBULATORY_CARE_PROVIDER_SITE_OTHER): Payer: Self-pay | Admitting: Internal Medicine

## 2018-04-24 ENCOUNTER — Encounter: Payer: Self-pay | Admitting: Internal Medicine

## 2018-04-24 ENCOUNTER — Encounter (INDEPENDENT_AMBULATORY_CARE_PROVIDER_SITE_OTHER): Payer: Self-pay

## 2018-04-24 VITALS — BP 132/86 | HR 52 | Ht 73.0 in | Wt 230.0 lb

## 2018-04-24 DIAGNOSIS — R0602 Shortness of breath: Secondary | ICD-10-CM

## 2018-04-24 DIAGNOSIS — R079 Chest pain, unspecified: Secondary | ICD-10-CM

## 2018-04-24 DIAGNOSIS — I493 Ventricular premature depolarization: Secondary | ICD-10-CM

## 2018-04-24 NOTE — Patient Instructions (Addendum)
Medication Instructions:  Your physician recommends that you continue on your current medications as directed. Please refer to the Current Medication list given to you today.  Make life style changes to reduce cholesterol-low fat/low cholesterol diet.  Labwork: None ordered.  Testing/Procedures: None ordered.  Follow-Up: Your physician wants you to follow-up in: as needed with Dr. Johney Frame.     Any Other Special Instructions Will Be Listed Below (If Applicable).  If you need a refill on your cardiac medications before your next appointment, please call your pharmacy.

## 2018-04-24 NOTE — Progress Notes (Signed)
   PCP: Dan Maker., MD   Primary EP: Dr Ermalinda Barrios is a 32 y.o. male who presents today for routine electrophysiology followup.  Since last being seen in our clinic, the patient reports doing very well.  Today, he denies symptoms of palpitations, chest pain, shortness of breath,  lower extremity edema, dizziness, presyncope, or syncope.  The patient is otherwise without complaint today.   Past Medical History:  Diagnosis Date  . ACL (anterior cruciate ligament) rupture, left 10/30/2011  . Acute lateral meniscus tear of left knee 10/30/2011  . Acute medial meniscus tear of left knee 10/30/2011  . DCM (dilated cardiomyopathy) (HCC)    EF 37% by Echo 07/19/11  . Hypertension   . PVC's (premature ventricular contractions)    s/p EPS and ablation 08/10/11 at [facility] and 03/08/12 at [facility], third ablation performed at [facility] by Dr Lavone Neri 11/14  . Torn ACL    Past Surgical History:  Procedure Laterality Date  . ANTERIOR CRUCIATE LIGAMENT REPAIR  10/30/2011   Procedure: RECONSTRUCTION ANTERIOR CRUCIATE LIGAMENT (ACL);  Surgeon: Eulas Post;  [facility]: [facility];  Service: Orthopedics;;  . EP study and radiofrequency ablation  08/10/11, 03/08/12, 09/03/13   PVC ablation  . MENISECTOMY  10/30/2011   Procedure: MENISECTOMY;  Surgeon: Eulas Post;  Location: [facility] OR;  Service: Orthopedics;  Laterality: Left;  medial and lateral  . WISDOM TOOTH EXTRACTION      ROS- all systems are reviewed and negatives except as per HPI above  No current outpatient medications on file.   No current facility-administered medications for this visit.     Physical Exam: Vitals:   04/24/18 1052  BP: 132/86  Pulse: (!) 52  Weight: 230 lb (104.3 kg)  Height: 6\' 1"  (1.854 m)    GEN- The patient is well appearing, alert and oriented x 3 today.   Head- normocephalic, atraumatic Eyes-  Sclera clear, conjunctiva pink Ears- hearing intact Oropharynx-  clear Lungs- Clear to ausculation bilaterally, normal work of breathing Heart- Regular rate and rhythm, no murmurs, rubs or gallops, PMI not laterally displaced GI- soft, NT, ND, + BS Extremities- no clubbing, cyanosis, or edema  Wt Readings from Last 3 Encounters:  04/24/18 230 lb (104.3 kg)  02/25/18 230 lb (104.3 kg)  02/08/15 200 lb (90.7 kg)    ETT, holter, and echo reviewed with him today  Assessment and Plan:  1. PVCs Resolved  2. Polysubstance abuse/ ETOH Cessation advised  3. SOB/ atypical chest pain Normal workup Regular exercise and weight loss advised  4. HL Declines statin We discussed lifestyle modification/ diet today  Return as needed  [name] MD, [facility] 04/24/2018 11:17 AM

## 2019-05-26 ENCOUNTER — Emergency Department (HOSPITAL_COMMUNITY): Payer: Self-pay

## 2019-05-26 ENCOUNTER — Other Ambulatory Visit: Payer: Self-pay

## 2019-05-26 ENCOUNTER — Emergency Department (HOSPITAL_COMMUNITY)
Admission: EM | Admit: 2019-05-26 | Discharge: 2019-05-26 | Disposition: A | Discharge status: Home / Self Care | Payer: Self-pay | Attending: Emergency Medicine | Admitting: Emergency Medicine

## 2019-05-26 ENCOUNTER — Encounter (HOSPITAL_COMMUNITY): Payer: Self-pay | Admitting: *Deleted

## 2019-05-26 ENCOUNTER — Telehealth: Payer: Self-pay | Admitting: Internal Medicine

## 2019-05-26 DIAGNOSIS — I10 Essential (primary) hypertension: Secondary | ICD-10-CM | POA: Insufficient documentation

## 2019-05-26 DIAGNOSIS — R079 Chest pain, unspecified: Secondary | ICD-10-CM

## 2019-05-26 DIAGNOSIS — Z79899 Other long term (current) drug therapy: Secondary | ICD-10-CM | POA: Insufficient documentation

## 2019-05-26 LAB — BASIC METABOLIC PANEL
Anion gap: 11 (ref 5–15)
BUN: 12 mg/dL (ref 6–20)
CO2: 25 mmol/L (ref 22–32)
Calcium: 9.7 mg/dL (ref 8.9–10.3)
Chloride: 102 mmol/L (ref 98–111)
Creatinine, Ser: 1.03 mg/dL (ref 0.61–1.24)
GFR calc Af Amer: >60 mL/min (ref >60)
GFR calc non Af Amer: >60 mL/min (ref >60)
Glucose, Bld: 106 mg/dL — ABNORMAL HIGH (ref 70–99)
Potassium: 4 mmol/L (ref 3.5–5.1)
Sodium: 138 mmol/L (ref 135–145)

## 2019-05-26 LAB — CBC
HCT: 46.6 % (ref 39.0–52.0)
Hemoglobin: 16 g/dL (ref 13.0–17.0)
MCH: 29.1 pg (ref 26.0–34.0)
MCHC: 34.3 g/dL (ref 30.0–36.0)
MCV: 84.9 fL (ref 80.0–100.0)
Platelets: 355 10*3/uL (ref 150–400)
RBC: 5.49 MIL/uL (ref 4.22–5.81)
RDW: 11.6 % (ref 11.5–15.5)
WBC: 4.7 10*3/uL (ref 4.0–10.5)
nRBC: 0 % (ref 0.0–0.2)

## 2019-05-26 LAB — TROPONIN I (HIGH SENSITIVITY): Troponin I (High Sensitivity): 2 ng/L (ref <18)

## 2019-05-26 MED ORDER — SODIUM CHLORIDE 0.9% FLUSH: 3.0000 mL | Freq: Once | INTRAVENOUS | Status: DC

## 2019-05-26 MED ORDER — KETOROLAC TROMETHAMINE 15 MG/ML IJ SOLN
15.0000 mg | Freq: Once | INTRAMUSCULAR | Status: AC
  Administered 2019-05-26: 15 mg via INTRAVENOUS
  Filled 2019-05-26: qty 1

## 2019-05-26 NOTE — ED Provider Notes (Signed)
[name] EMERGENCY [facility] Provider Note   CSN: [id number] Arrival date & time: 05/26/19  [id number]    History   Chief Complaint Chief Complaint  Patient presents with  . Chest Pain    HPI Kevin RollerJulian D Simpson is a 33 y.o. male.     HPI 33 year old male presents with chest pain.  Started yesterday morning around 7 AM.  Was at its worst last night and was about an 8 out of 10.  Today it is about a 3 out of 10.  He has been feeling short of breath and the chest pain feels like a tightness and pressure in his middle and left-sided chest and shoulder.  No significant back or abdominal pain.  The pain is pretty much constant.  He states he has been drinking a lot this weekend and played 72 holes of golf in 3 days.  He also did cocaine which he has not done in a long time.  He endorses a history of hypertension but does not think he has high cholesterol, diabetes, family history of cardiac disease, and he does not smoke.  Pain seems to be at its worst when he is lying flat.  He has not noticed any pain with exertion.  No pleuritic pain or leg swelling.  Past Medical History:  Diagnosis Date  . ACL (anterior cruciate ligament) rupture, left 10/30/2011  . Acute lateral meniscus tear of left knee 10/30/2011  . Acute medial meniscus tear of left knee 10/30/2011  . DCM (dilated cardiomyopathy) (HCC)    EF 37% by Echo 07/19/11  . Hypertension   . PVC's (premature ventricular contractions)    s/p EPS and [facility]10[facility] and [facility]/12 [facility], [facility] by Dr Lavone NeriYamata 11/14  . Torn ACL     Patient Active Problem List   Diagnosis Date Noted  . Lightheadedness 04/14/2014  . ACL (anterior cruciate ligament) rupture, left 10/30/2011  . Acute medial meniscus tear of left knee 10/30/2011  . Acute lateral meniscus tear of left knee 10/30/2011  . Dilated cardiomyopathy (HCC) 07/20/2011  . PVC's (premature  ventricular contractions) 07/20/2011    Past Surgical History:  Procedure Laterality Date  . ANTERIOR CRUCIATE LIGAMENT REPAIR  10/30/2011   Procedure: RECONSTRUCTION ANTERIOR CRUCIATE LIGAMENT (ACL);  Surgeon: Eulas PostJoshua P Landau;  Location: [facility] OR;  Service: Orthopedics;;  . EP study and radiofrequency ablation  08/10/11, 03/08/12, 09/03/13   PVC ablation  . MENISECTOMY  10/30/2011   Procedure: MENISECTOMY;  Surgeon: Eulas PostJoshua P Landau;  Location: [facility] OR;  Service: Orthopedics;  Laterality: Left;  medial and lateral  . WISDOM TOOTH EXTRACTION          Home Medications    Prior to Admission medications   Medication Sig Start Date End Date Taking? Authorizing Provider  ibuprofen (ADVIL) 200 MG tablet Take 400 mg by mouth every 6 (six) hours as needed for moderate pain.   Yes [provider]    Family History No family history on file.  Social History Social History   Tobacco Use  . Smoking status: Never Smoker  . Smokeless tobacco: Never Used  Substance Use Topics  . Alcohol use: Yes    Comment: 3 times per week  . Drug use: Yes    Types: Marijuana, Cocaine    Comment: marijuana not currentlt using     Allergies   Patient has no known allergies.   Review of Systems Review  of Systems  Respiratory: Positive for shortness of breath.   Cardiovascular: Positive for chest pain. Negative for leg swelling.  Gastrointestinal: Negative for abdominal pain and vomiting.  All other systems reviewed and are negative.    Physical Exam Updated Vital Signs BP 112/78   Pulse (!) 55   Temp 98.2 F (36.8 C) (Oral)   Resp 14   Ht 6\' 1"  (1.854 m)   Wt 104.3 kg   SpO2 96%   BMI 30.34 kg/m   Physical Exam Vitals signs and nursing note reviewed.  Constitutional:      General: He is not in acute distress.    Appearance: He is well-developed. He is not ill-appearing or diaphoretic.  HENT:     Head: Normocephalic and atraumatic.     Right Ear: External ear normal.      Left Ear: External ear normal.     Nose: Nose normal.  Eyes:     General:        Right eye: No discharge.        Left eye: No discharge.  Neck:     Musculoskeletal: Neck supple.  Cardiovascular:     Rate and Rhythm: Normal rate and regular rhythm.     Heart sounds: Normal heart sounds.  Pulmonary:     Effort: Pulmonary effort is normal.     Breath sounds: Normal breath sounds.  Chest:     Chest wall: No tenderness.  Abdominal:     Palpations: Abdomen is soft.     Tenderness: There is no abdominal tenderness.  Musculoskeletal:     Right lower leg: No edema.     Left lower leg: No edema.  Skin:    General: Skin is warm and dry.  Neurological:     Mental Status: He is alert.  Psychiatric:        Mood and Affect: Mood is not anxious.      ED Treatments / Results  Labs (all labs ordered are listed, but only abnormal results are displayed) Labs Reviewed  BASIC METABOLIC PANEL - Abnormal; Notable for the following components:      Result Value   Glucose, Bld 106 (*)    All other components within normal limits  CBC  TROPONIN I (HIGH SENSITIVITY)    EKG EKG Interpretation  Date/Time:  Monday May 26 2019 10:50:56 EDT Ventricular Rate:  52 PR Interval:    QRS Duration: 94 QT Interval:  414 QTC Calculation: 385 R Axis:   62 Text Interpretation:  Sinus rhythm ST elev, probable normal early repol pattern no reciprocal changes No old tracing to compare Confirmed by Pricilla Loveless [id number]) on 05/26/2019 11:39:01 AM   Radiology Dg Chest 2 View  Result Date: 05/26/2019 CLINICAL DATA:  Chest pain EXAM: CHEST - 2 VIEW COMPARISON:  October 24, 2011 FINDINGS: Lungs are clear. Heart size and pulmonary vascularity are normal. No adenopathy. No pneumothorax. No bone lesions. IMPRESSION: No edema or consolidation. Electronically Signed   By: Bretta Bang III M.D.   On: 05/26/2019 10:44    Procedures Procedures (including critical care time)  Medications Ordered in ED  Medications  ketorolac (TORADOL) 15 MG/ML injection 15 mg (15 mg Intravenous Given 05/26/19 1055)     Initial Impression / Assessment and Plan / ED Course  I have reviewed the triage vital signs and the nursing notes.  Pertinent labs & imaging results that were available during my care of the patient were reviewed by me and considered in my  medical decision making (see chart for details).        Patient's ECG and troponin are benign.  Given about 24+ hours of symptoms that have been constant, I think it is unlikely he has ACS with these findings.  Discussed with patient, he is okay with going home now as opposed to waiting on a second troponin and given my pretty low suspicion for ACS I think this is reasonable.  Likely related to the weekend alcohol binge and cocaine use.  Discharged home with return precautions.  Final Clinical Impressions(s) / ED Diagnoses   Final diagnoses:  Nonspecific chest pain    ED Discharge Orders    None       Sherwood Gambler, MD 05/26/19 [phone]

## 2019-05-26 NOTE — Telephone Encounter (Signed)
New message   Pt c/o of Chest Pain: STAT if CP now or developed within 24 hours  1. Are you having CP right now? Yes   2. Are you experiencing any other symptoms (ex. SOB, nausea, vomiting, sweating)? Sweating   3. How long have you been experiencing CP? Patient states that pain started yesterday morning   4. Is your CP continuous or coming and going? continuous  5. Have you taken Nitroglycerin? No  ?

## 2019-05-26 NOTE — ED Triage Notes (Addendum)
Pt states played golf all weekend.  Pain began yesterday morning, then he played 18 holes of golf.  By the end of the day the pain was so bad he could not sleep.  C/o palpitations. Pain increases when he's laying down.  Hx of 3 cardiac ablations.  Pt drank a lot of etoh and used cocaine this weekend as well.

## 2019-05-26 NOTE — Telephone Encounter (Signed)
I spoke to the patient who is complaining of CP, SOB and heart rate going slow/fast at times.  This all started yesterday 8/9, with no relief.  I recommended the ED at [facility] for further evaluation via EMS.  He verbalized understanding.

## 2019-05-26 NOTE — Discharge Instructions (Addendum)
If you develop recurrent, continued, or worsening chest pain, shortness of breath, fever, vomiting, abdominal or back pain, or any other new/concerning symptoms then return to the ER for evaluation.  

## 2019-09-30 NOTE — Progress Notes (Deleted)
Cardiology Office Note Date:  09/30/2019  Patient ID:  Kevin Simpson, Kevin Simpson August 20, 1986, MRN [id number] PCP:  Dan Maker., MD  Electrophysiologist  Dr. Johney Frame  ***refresh   Chief Complaint: *** ?? Annual visit  History of Present Illness: Kevin Simpson is a 33 y.o. male with history of DCM, PVCs, polysubstance abuse, HLD.  He comes in today to be seen for Dr. Johney Frame, last seen by him Sept 2019. At that time doing well, no symptoms.  Dr. Johney Frame mentioning his PVCs resolved, h/o atypical CP/SOB that had negative w/u, and advised weight loss, exercise, counseled on polysubstance abuse.  He had an ER visit Aug 2020 for CP, he admitted to have been drinking a lot the weekend and had also used cocaine.  By his symptom description and a neg Trip, with low suspicion of ACS and symptoms likely 2/2 his weekend binge, was discharged from the ER.  *** fam hx *** meds *** symptoms ***palps, syncope? *** lipids?   Past Medical History:  Diagnosis Date  . ACL (anterior cruciate ligament) rupture, left 10/30/2011  . Acute lateral meniscus tear of left knee 10/30/2011  . Acute medial meniscus tear of left knee 10/30/2011  . DCM (dilated cardiomyopathy) (HCC)    EF 37% by Echo 07/19/11  . Hypertension   . PVC's (premature ventricular contractions)    s/p EPS and ablation 08/10/11 at [facility] and 03/08/12 at [facility], third ablation performed at [facility] by Dr Lavone Neri 11/14  . Torn ACL     Past Surgical History:  Procedure Laterality Date  . ANTERIOR CRUCIATE LIGAMENT REPAIR  10/30/2011   Procedure: RECONSTRUCTION ANTERIOR CRUCIATE LIGAMENT (ACL);  Surgeon: Eulas Post;  Location: [facility] OR;  Service: Orthopedics;;  . EP study and radiofrequency ablation  08/10/11, 03/08/12, 09/03/13   PVC ablation  . MENISECTOMY  10/30/2011   Procedure: MENISECTOMY;  Surgeon: Eulas Post;  Location: [facility] OR;  Service: Orthopedics;  Laterality: Left;  medial and  lateral  . WISDOM TOOTH EXTRACTION      Current Outpatient Medications  Medication Sig Dispense Refill  . ibuprofen (ADVIL) 200 MG tablet Take 400 mg by mouth every 6 (six) hours as needed for moderate pain.     No current facility-administered medications for this visit.    Allergies:   Patient has no known allergies.   Social History:  The patient  reports that he has never smoked. He has never used smokeless tobacco. He reports current alcohol use. He reports current drug use. Drugs: Marijuana and Cocaine.   Family History:  The patient's family history is not on file.***  ROS:  Please see the history of present illness.    All other systems are reviewed and otherwise negative.   PHYSICAL EXAM: *** VS:  There were no vitals taken for this visit. BMI: There is no height or weight on file to calculate BMI. Well nourished, well developed, in no acute distress  HEENT: normocephalic, atraumatic  Neck: no JVD, carotid bruits or masses Cardiac:  *** RRR; no significant murmurs, no rubs, or gallops Lungs:  *** CTA b/l, no wheezing, rhonchi or rales  Abd: soft, nontender MS: no deformity or *** atrophy Ext: *** no edema  Skin: warm and dry, no rash Neuro:  No gross deficits appreciated Psych: euthymic mood, full affect    EKG:  Not done today   03/12/2018: ETT  Blood pressure demonstrated a normal response to exercise.  There was  no ST segment deviation noted during stress.   Normal ETT Normal hemodynamic response Good exercise tolerance 11.9 METS     03/12/2018: TTE Study Conclusions - Left ventricle: The cavity size was normal. Wall thickness was   normal. Systolic function was normal. The estimated ejection   fraction was in the range of 50% to 55%. Wall motion was normal;   there were no regional wall motion abnormalities. Left   ventricular diastolic function parameters were normal. - Aortic valve: There was trivial regurgitation. - Mitral valve: Calcified  annulus. There was mild regurgitation.  Impressions: - Normal LV function; trace AI; mild MR.    Recent Labs: 05/26/2019: BUN 12; Creatinine, Ser 1.03; Hemoglobin 16.0; Platelets 355; Potassium 4.0; Sodium 138  No results found for requested labs within last 8760 hours.   CrCl cannot be calculated (Patient's most recent lab result is older than the maximum 21 days allowed.).   Wt Readings from Last 3 Encounters:  05/26/19 230 lb (104.3 kg)  04/24/18 230 lb (104.3 kg)  02/25/18 230 lb (104.3 kg)     Other studies reviewed: Additional studies/records reviewed today include: summarized above  ASSESSMENT AND PLAN:  1. PVCs     ***  2. Polysubstance abuse     ***  3. HLD     ***    Disposition: F/u with ***  Current medicines are reviewed at length with the patient today.  The patient did not have any concerns regarding medicines.***  Signed, Tommye Standard, PA-C 09/30/2019 5:27 AM     [facility] [address] [place] [address] [phone] (office)  [phone] (fax)

## 2019-10-01 ENCOUNTER — Ambulatory Visit: Payer: Self-pay | Admitting: Physician Assistant

## 2019-10-12 NOTE — Progress Notes (Addendum)
Cardiology Office Note Date:  10/13/2019  Patient ID:  Kevin Simpson, Kevin Simpson 29-Aug-1986, MRN [id number] PCP:  Myrtis Hopping., MD  Electrophysiologist  Dr. Rayann Heman    Chief Complaint:  Annual visit, CP, palpitations  History of Present Illness: Kevin Simpson is a 33 y.o. male with history of DCM, PVCs, polysubstance abuse, HLD.  He comes in today to be seen for Dr. Rayann Heman, last seen by him Sept 2019. At that time doing well, no symptoms.  Dr. Rayann Heman mentioning his PVCs resolved, h/o atypical CP/SOB that had negative w/u, and advised weight loss, exercise, counseled on polysubstance abuse.  He had an ER visit Aug 2020 for CP, he admitted to have been drinking a lot the weekend and had also used cocaine.  By his symptom description and a neg Trop, with low suspicion of ACS and symptoms likely 2/2 his weekend binge, was discharged from the ER.  He comes today with a number of concerns/symptoms.  He mentions CP, this is predominantly felt at night, associated with palpitations with a feeling some times that his heart is slow and others feels fast.  These wake him and are quite anxiety provoking.  He and his fiance recently had a baby, and will notice when up at night to feed her, he will feels central chest pain.  These are all a feeling of tihgness, non radiating, some associated with feeling SOB, some not. These are not noted day time, not exertional.  He does no exercise, and minimal physical activity, likes to gold when he cane, at his level of day time exertional he does not have CP. He is very sedentary, works at home, desk job, and just doesn't exercise otherwise. No near syncope or syncope.  These symptoms date back over a year, recalls discussing them with Der. Allred and prompting last years evaluation.  Though he feels like they are worse, more often, almost every night and more worrisome to him.  He denies any known family cardiac history, he thinks his cholesterol has been  high in the past, never on medicines.  He denies smoking cigarettes, but will smoke a cigar occassionally (does not inhale).  He continues to drink, denies nightly drinking, will drink weekends and does occasionally use cocaine still.  In d/w him about his ER visit in August, he admitted to long weekend of significant ETOH intake and by Monday "felt the worst I have ever felt"   Past Medical History:  Diagnosis Date  . ACL (anterior cruciate ligament) rupture, left 10/30/2011  . Acute lateral meniscus tear of left knee 10/30/2011  . Acute medial meniscus tear of left knee 10/30/2011  . DCM (dilated cardiomyopathy) ([place])    EF 37% by Echo 07/19/11  . Hypertension   . PVC's (premature ventricular contractions)    s/p EPS and ablation 08/10/11 at [facility] and 03/08/12 at [facility], third ablation performed at [facility] by Dr Inda Castle 11/14  . Torn ACL     Past Surgical History:  Procedure Laterality Date  . ANTERIOR CRUCIATE LIGAMENT REPAIR  10/30/2011   Procedure: RECONSTRUCTION ANTERIOR CRUCIATE LIGAMENT (ACL);  Surgeon: Johnny Bridge;  Location: [place] [facility];  Service: Orthopedics;;  . EP study and radiofrequency ablation  08/10/11, 03/08/12, 09/03/13   PVC ablation  . MENISECTOMY  10/30/2011   Procedure: MENISECTOMY;  Surgeon: Johnny Bridge;  Location: [place];  Service: Orthopedics;  Laterality: Left;  medial and lateral  . WISDOM TOOTH EXTRACTION  Current Outpatient Medications  Medication Sig Dispense Refill  . ibuprofen (ADVIL) 200 MG tablet Take 400 mg by mouth every 6 (six) hours as needed for moderate pain.    . metoprolol tartrate (LOPRESSOR) 100 MG tablet Take 1 tablet (100 mg total) by mouth once for 1 dose. Two hours prior to procedure 1 tablet 0   No current facility-administered medications for this visit.    Allergies:   Patient has no known allergies.   Social History:  The patient  reports that he has never smoked. He has never used  smokeless tobacco. He reports current alcohol use. He reports current drug use. Drugs: Marijuana and Cocaine.   Family History:  The patient's family history includes Cancer in his paternal grandfather.  ROS:  Please see the history of present illness.    All other systems are reviewed and otherwise negative.   PHYSICAL EXAM:  VS:  BP 128/84   Pulse 87   Ht 6\' 1"  (1.854 m)   Wt 240 lb (108.9 kg)   SpO2 98%   BMI 31.66 kg/m  BMI: Body mass index is 31.66 kg/m. Well nourished, well developed, in no acute distress  HEENT: normocephalic, atraumatic  Neck: no JVD, carotid bruits or masses Cardiac:  RRR; no significant murmurs, no rubs, or gallops Lungs:  CTA b/l, no wheezing, rhonchi or rales  Abd: soft, nontender MS: no deformity or atrophy Ext: no edema  Skin: warm and dry, no rash Neuro:  No gross deficits appreciated Psych: euthymic mood, full affect    EKG:  SR 77bpm, no ST/T changes, normal EKG and unchanged from prior   03/12/2018: ETT  Blood pressure demonstrated a normal response to exercise.  There was no ST segment deviation noted during stress.   Normal ETT Normal hemodynamic response Good exercise tolerance 11.9 METS     03/12/2018: TTE Study Conclusions - Left ventricle: The cavity size was normal. Wall thickness was   normal. Systolic function was normal. The estimated ejection   fraction was in the range of 50% to 55%. Wall motion was normal;   there were no regional wall motion abnormalities. Left   ventricular diastolic function parameters were normal. - Aortic valve: There was trivial regurgitation. - Mitral valve: Calcified annulus. There was mild regurgitation.  Impressions: - Normal LV function; trace AI; mild MR.    May 2019, 48hour monitor Sinus rhythm Rare premature atrial contractions, no premature ventricular contractions Average heart rate 72 bpm Nocturnal bradycardia noted No sustained arrhythmias No AV block or pause  episodes    Recent Labs: 05/26/2019: BUN 12; Creatinine, Ser 1.03; Hemoglobin 16.0; Platelets 355; Potassium 4.0; Sodium 138  No results found for requested labs within last 8760 hours.   CrCl cannot be calculated (Patient's most recent lab result is older than the maximum 21 days allowed.).   Wt Readings from Last 3 Encounters:  10/13/19 240 lb (108.9 kg)  05/26/19 230 lb (104.3 kg)  04/24/18 230 lb (104.3 kg)     Other studies reviewed: Additional studies/records reviewed today include: summarized above  ASSESSMENT AND PLAN:  1. PVCs     H/o 3 PVC ablations, last 2014     These seem controlled     He perceives a very slow HR at night ?     Having nightly symptoms that wake him, will repeat  2. Polysubstance abuse     Counseled at length today  3. HLD     Will check labs  4.  CP     Atypical in that seems to be night time only, not exertional, not new, but he reports escalating and becoming very anxiety provoking      Negative EST last year      + CV risk with ETOH, drugs, cigars, sedentary      Though young age, no known family hx      After much discussion, will send him for coronary CT    Disposition: F/u with him in 2 mo, sooner if needed  Current medicines are reviewed at length with the patient today.  The patient did not have any concerns regarding medicines.  Norma Fredrickson, PA-C 10/13/2019 1:09 PM     [facility] [address] [place] [address] [phone] (office)  [phone] (fax)

## 2019-10-13 ENCOUNTER — Ambulatory Visit (INDEPENDENT_AMBULATORY_CARE_PROVIDER_SITE_OTHER): Payer: Self-pay | Admitting: Physician Assistant

## 2019-10-13 ENCOUNTER — Other Ambulatory Visit: Payer: Self-pay

## 2019-10-13 ENCOUNTER — Telehealth: Payer: Self-pay | Admitting: *Deleted

## 2019-10-13 ENCOUNTER — Encounter: Payer: Self-pay | Admitting: Physician Assistant

## 2019-10-13 VITALS — BP 128/84 | HR 87 | Ht 73.0 in | Wt 240.0 lb

## 2019-10-13 DIAGNOSIS — R079 Chest pain, unspecified: Secondary | ICD-10-CM

## 2019-10-13 DIAGNOSIS — R002 Palpitations: Secondary | ICD-10-CM

## 2019-10-13 DIAGNOSIS — I493 Ventricular premature depolarization: Secondary | ICD-10-CM

## 2019-10-13 MED ORDER — METOPROLOL TARTRATE 100 MG PO TABS: 100.0000 mg | ORAL_TABLET | Freq: Once | ORAL | 0 refills | Status: DC

## 2019-10-13 NOTE — Patient Instructions (Addendum)
Medication Instructions:   Your physician recommends that you continue on your current medications as directed. Please refer to the Current Medication list given to you today.  EXCEPT: ON DAY OF SCHEDULED CT SCAN TAKE METOPROLOL ( LOPRESSOR) 100 MG ONE DOSE TWO HOURS PRIOR TO PROCEDURE TIME   *If you need a refill on your cardiac medications before your next appointment, please call your pharmacy*  Lab Work:  RETURN LFT AND AND LIPIDS  AND BMET FOR CT  SCAN   If you have labs (blood work) drawn today and your tests are completely normal, you will receive your results only by: Marland Kitchen MyChart Message (if you have MyChart) OR . A paper copy in the mail If you have any lab test that is abnormal or we need to change your treatment, we will call you to review the results.  Testing/Procedures: .Non-Cardiac CT scanning, (CAT scanning), is a noninvasive, special x-ray that produces cross-sectional images of the body using x-rays and a computer. CT scans help physicians diagnose and treat medical conditions. For some CT exams, a contrast material is used to enhance visibility in the area of the body being studied. CT scans provide greater clarity and reveal more details than regular x-ray exams.  Your physician has recommended that you wear a holter monitor. Holter monitors are medical devices that record the heart's electrical activity. Doctors most often use these monitors to diagnose arrhythmias. Arrhythmias are problems with the speed or rhythm of the heartbeat. The monitor is a small, portable device. You can wear one while you do your normal daily activities. This is usually used to diagnose what is causing palpitations/syncope (passing out).( PATIENT TOLD AT CHECK OUT AND AWARE  )  Follow-Up: At [facility], you and your health needs are our priority.  As part of our continuing mission to provide you with exceptional heart care, we have created designated Provider Care Teams.  These Care Teams include  your primary Cardiologist (physician) and Advanced Practice Providers (APPs -  Physician Assistants and Nurse Practitione  2 Months   The format for your next appointment:   In Person  Provider:  Tommye Standard, PA-C    Other Instructions Your cardiac CT will be scheduled at one of the below locations:   [facility] [address] [place], [place] [address] [phone]  [place] [address] [place], [place] [address] [phone]  If scheduled at [facility], please arrive at the [facility] main entrance of [facility] 30-45 minutes prior to test start time. Proceed to the [facility] Radiology Department (first floor) to check-in and test prep.  If scheduled at [facility], please arrive 15 mins early for check-in and test prep.  Please follow these instructions carefully (unless otherwise directed):  Hold all erectile dysfunction medications at least 3 days (72 hrs) prior to test.  On the Night Before the Test: . Be sure to Drink plenty of water. . Do not consume any caffeinated/decaffeinated beverages or chocolate 12 hours prior to your test. . Do not take any antihistamines 12 hours prior to your test.  On the Day of the Test: . Drink plenty of water. Do not drink any water within one hour of the test. . Do not eat any food 4 hours prior to the test. . You may take your regular medications prior to the test.  . Take metoprolol (Lopressor)  100 mg one dose two  hours prior to test.  After the Test: . Drink plenty of water. . After receiving IV contrast, you may experience a mild flushed feeling. This is normal. . On occasion, you may experience a mild rash up to 24 hours after the test. This is not dangerous. If this occurs, you can take Benadryl 25 mg and increase your fluid intake. . If you experience trouble breathing, this can be serious. If it is severe  call 911 IMMEDIATELY. If it is mild, please call our office.  Once we have confirmed authorization from your insurance company, we will call you to set up a date and time for your test.   For non-scheduling related questions, please contact the cardiac imaging nurse navigator should you have any questions/concerns: Rockwell Alexandria, RN Navigator Cardiac Imaging [facility] [phone] Office

## 2019-10-13 NOTE — Telephone Encounter (Signed)
3 day ZIO XT monitor to be mailed to the patients home.  Patient given [employer] telephone number to call and apply for financial assistance.

## 2019-10-24 ENCOUNTER — Other Ambulatory Visit: Payer: Self-pay

## 2019-10-24 ENCOUNTER — Other Ambulatory Visit: Payer: Self-pay | Admitting: *Deleted

## 2019-10-24 DIAGNOSIS — R079 Chest pain, unspecified: Secondary | ICD-10-CM

## 2019-10-24 DIAGNOSIS — I493 Ventricular premature depolarization: Secondary | ICD-10-CM

## 2019-10-24 LAB — BASIC METABOLIC PANEL
BUN/Creatinine Ratio: 16 (ref 9–20)
BUN: 17 mg/dL (ref 6–20)
CO2: 23 mmol/L (ref 20–29)
Calcium: 9.8 mg/dL (ref 8.7–10.2)
Chloride: 102 mmol/L (ref 96–106)
Creatinine, Ser: 1.06 mg/dL (ref 0.76–1.27)
GFR calc Af Amer: 107 mL/min/{1.73_m2} (ref >59)
GFR calc non Af Amer: 92 mL/min/{1.73_m2} (ref >59)
Glucose: 91 mg/dL (ref 65–99)
Potassium: 4.4 mmol/L (ref 3.5–5.2)
Sodium: 140 mmol/L (ref 134–144)

## 2019-10-24 LAB — LIPID PANEL
Chol/HDL Ratio: 8 ratio — ABNORMAL HIGH (ref 0.0–5.0)
Cholesterol, Total: 287 mg/dL — ABNORMAL HIGH (ref 100–199)
HDL: 36 mg/dL — ABNORMAL LOW (ref >39)
LDL Chol Calc (NIH): 201 mg/dL — ABNORMAL HIGH (ref 0–99)
Triglycerides: 251 mg/dL — ABNORMAL HIGH (ref 0–149)
VLDL Cholesterol Cal: 50 mg/dL — ABNORMAL HIGH (ref 5–40)

## 2019-10-24 LAB — HEPATIC FUNCTION PANEL
ALT: 127 IU/L — ABNORMAL HIGH (ref 0–44)
AST: 44 IU/L — ABNORMAL HIGH (ref 0–40)
Albumin: 5.9 g/dL — ABNORMAL HIGH (ref 4.0–5.0)
Alkaline Phosphatase: 81 IU/L (ref 39–117)
Bilirubin Total: 0.6 mg/dL (ref 0.0–1.2)
Bilirubin, Direct: 0.13 mg/dL (ref 0.00–0.40)
Total Protein: 7.2 g/dL (ref 6.0–8.5)

## 2019-11-27 ENCOUNTER — Telehealth (HOSPITAL_COMMUNITY): Payer: Self-pay | Admitting: Emergency Medicine

## 2019-11-27 NOTE — Telephone Encounter (Signed)
Reaching out to patient to offer assistance regarding upcoming cardiac imaging study; pt verbalizes understanding of appt date/time, parking situation and where to check in, pre-test NPO status and medications ordered, and verified current allergies; name and call back number provided for further questions should they arise Rockwell Alexandria RN Navigator Cardiac Imaging [facility] [phone] office [phone] cell  Pt calling asking about medication on instruction sheet. I confirmed with him that this medication was sent to pharmacy on file. Pt verified that was his pharmacy and would pick it up. Pt verbalized understanding of when to take medication for CCTA appt

## 2019-11-28 ENCOUNTER — Encounter: Payer: Self-pay | Admitting: *Deleted

## 2019-11-28 ENCOUNTER — Ambulatory Visit (HOSPITAL_COMMUNITY)
Admission: RE | Admit: 2019-11-28 | Discharge: 2019-11-28 | Disposition: A | Discharge status: Home / Self Care | Payer: Self-pay | Source: Ambulatory Visit | Attending: Physician Assistant | Admitting: Physician Assistant

## 2019-11-28 ENCOUNTER — Other Ambulatory Visit: Payer: Self-pay

## 2019-11-28 DIAGNOSIS — R079 Chest pain, unspecified: Secondary | ICD-10-CM | POA: Insufficient documentation

## 2019-11-28 MED ORDER — IOHEXOL 350 MG/ML SOLN
100.0000 mL | Freq: Once | INTRAVENOUS | Status: AC | PRN
  Administered 2019-11-28: 100 mL via INTRAVENOUS

## 2019-11-28 MED ORDER — NITROGLYCERIN 0.4 MG SL SUBL
0.8000 mg | SUBLINGUAL_TABLET | Freq: Once | SUBLINGUAL | Status: AC
  Administered 2019-11-28: 0.8 mg via SUBLINGUAL

## 2019-11-28 MED ORDER — NITROGLYCERIN 0.4 MG SL SUBL
SUBLINGUAL_TABLET | SUBLINGUAL | Status: AC
  Filled 2019-11-28: qty 2

## 2019-12-12 ENCOUNTER — Ambulatory Visit: Payer: Self-pay | Admitting: Physician Assistant

## 2019-12-21 NOTE — Progress Notes (Deleted)
Cardiology Office Note Date:  12/21/2019  Patient ID:  Kevin Simpson, Kevin Simpson [birth year], MRN [id number] PCP:  Myrtis Hopping., MD  Electrophysiologist  Dr. Rayann Heman    Chief Complaint:  *** follow up on test results  History of Present Illness: Kevin Simpson is a 34 y.o. male with history of DCM, PVCs, polysubstance abuse, HLD.  He comes in today to be seen for Dr. Rayann Heman, last seen by him Sept 2019. At that time doing well, no symptoms.  Dr. Rayann Heman mentioning his PVCs resolved, h/o atypical CP/SOB that had negative w/u, and advised weight loss, exercise, counseled on polysubstance abuse.  He had an ER visit Aug 2020 for CP, he admitted to have been drinking a lot the weekend and had also used cocaine.  By his symptom description and a neg Trop, with low suspicion of ACS and symptoms likely 2/2 his weekend binge, was discharged from the ER.  I saw him 10/13/2019, he had a number of concerns/symptoms.  He mentioned CP, this is predominantly felt at night, associated with palpitations with a feeling some times that his heart is slow and others feels fast.  These wake him and are quite anxiety provoking.  He and his fiance recently had a baby, and noticed when up at night to feed her, he felt central chest pain.  These are all a feeling of tihgness, non radiating, some associated with feeling SOB, some not. These are not noted day time, not exertional.  He does no exercise, and minimal physical activity, at his level of day time exertional he does not have CP. He is very sedentary, works at home, desk job, and just doesn't exercise otherwise. No near syncope or syncope. These symptoms dated back over a year, recalled discussing them with Dr. Rayann Heman and prompting last years evaluation.  Though he felt like they were worse, more often, almost every night and more worrisome to him. He denied any known family cardiac history, he thinks his cholesterol has been high in the past, never on  medicines. He denied smoking cigarettes, but will smoke a cigar occassionally (does not inhale).  He continues to drink, denies nightly drinking, will drink weekends and was occasionally using cocaine still. In d/w him about his ER visit in August, he admitted to long weekend of significant ETOH intake and by Monday "felt the worst I have ever felt" Planned for Coronary CT and 48hr monitor with nightly symptoms  CT noted Ca++ score of zero and no CAD, the 48 hour monitor not yet completed  *** symptoms *** needs non-cardiac eval *** needs to stop cocaine, and reduce ETOH    Past Medical History:  Diagnosis Date  . ACL (anterior cruciate ligament) rupture, left 10/30/2011  . Acute lateral meniscus tear of left knee 10/30/2011  . Acute medial meniscus tear of left knee 10/30/2011  . DCM (dilated cardiomyopathy) ([place])    EF 37% by Echo 07/19/11  . Hypertension   . PVC's (premature ventricular contractions)    s/p EPS and ablation 08/10/11 at [facility] and 03/08/12 at [facility], third ablation performed at [facility] by Dr Inda Castle 11/14  . Torn ACL     Past Surgical History:  Procedure Laterality Date  . ANTERIOR CRUCIATE LIGAMENT REPAIR  10/30/2011   Procedure: RECONSTRUCTION ANTERIOR CRUCIATE LIGAMENT (ACL);  Surgeon: Johnny Bridge;  Location: [place] [facility];  Service: Orthopedics;;  . EP study and radiofrequency ablation  08/10/11, 03/08/12, 09/03/13   PVC  ablation  . MENISECTOMY  10/30/2011   Procedure: MENISECTOMY;  Surgeon: Eulas Post;  Location: [facility];  Service: Orthopedics;  Laterality: Left;  medial and lateral  . WISDOM TOOTH EXTRACTION      Current Outpatient Medications  Medication Sig Dispense Refill  . ibuprofen (ADVIL) 200 MG tablet Take 400 mg by mouth every 6 (six) hours as needed for moderate pain.    . metoprolol tartrate (LOPRESSOR) 100 MG tablet Take 1 tablet (100 mg total) by mouth once for 1 dose. Two hours prior to procedure 1 tablet  0   No current facility-administered medications for this visit.    Allergies:   Patient has no known allergies.   Social History:  The patient  reports that he has never smoked. He has never used smokeless tobacco. He reports current alcohol use. He reports current drug use. Drugs: Marijuana and Cocaine.   Family History:  The patient's family history includes Cancer in his paternal grandfather.  ROS:  Please see the history of present illness.    All other systems are reviewed and otherwise negative.   PHYSICAL EXAM:  VS:  There were no vitals taken for this visit. BMI: There is no height or weight on file to calculate BMI. Well nourished, well developed, in no acute distress  HEENT: normocephalic, atraumatic  Neck: no JVD, carotid bruits or masses Cardiac:  *** RRR; no significant murmurs, no rubs, or gallops Lungs:  *** CTA b/l, no wheezing, rhonchi or rales  Abd: soft, nontender MS: no deformity or *** atrophy Ext: *** no edema  Skin: warm and dry, no rash Neuro:  No gross deficits appreciated Psych: euthymic mood, full affect    EKG:  Not done today    11/28/2019: Coronary CT IMPRESSION: 1. No evidence of CAD, CADRADS = 0. 2. Coronary calcium score of 0. This was 0 percentile for age and sex matched control. 3. Normal coronary origin with left dominance.   03/12/2018: ETT  Blood pressure demonstrated a normal response to exercise.  There was no ST segment deviation noted during stress.   Normal ETT Normal hemodynamic response Good exercise tolerance 11.9 METS     03/12/2018: TTE Study Conclusions - Left ventricle: The cavity size was normal. Wall thickness was   normal. Systolic function was normal. The estimated ejection   fraction was in the range of 50% to 55%. Wall motion was normal;   there were no regional wall motion abnormalities. Left   ventricular diastolic function parameters were normal. - Aortic valve: There was trivial regurgitation. -  Mitral valve: Calcified annulus. There was mild regurgitation.  Impressions: - Normal LV function; trace AI; mild MR.    May 2019, 48hour monitor Sinus rhythm Rare premature atrial contractions, no premature ventricular contractions Average heart rate 72 bpm Nocturnal bradycardia noted No sustained arrhythmias No AV block or pause episodes    Recent Labs: 05/26/2019: Hemoglobin 16.0; Platelets 355 10/24/2019: ALT 127; BUN 17; Creatinine, Ser 1.06; Potassium 4.4; Sodium 140  10/24/2019: Chol/HDL Ratio 8.0; Cholesterol, Total 287; HDL 36; LDL Chol Calc (NIH) 201; Triglycerides 251   CrCl cannot be calculated (Patient's most recent lab result is older than the maximum 21 days allowed.).   Wt Readings from Last 3 Encounters:  10/13/19 240 lb (108.9 kg)  05/26/19 230 lb (104.3 kg)  04/24/18 230 lb (104.3 kg)     Other studies reviewed: Additional studies/records reviewed today include: summarized above  ASSESSMENT AND PLAN:  1. PVCs  H/o 3 PVC ablations, last 2014     ***  2. Polysubstance abuse     *** Counseled at length today  3. HLD     *** Will check labs  4. CP     Atypical in that seems to be night time only, not exertional, not new, but he reports escalating and becoming very anxiety provoking      Negative EST last year      + CV risk with ETOH, drugs, cigars, sedentary      ***    Disposition: F/u with him in 2 mo, sooner if needed  Current medicines are reviewed at length with the patient today.  The patient did not have any concerns regarding medicines.  Kevin Fredrickson, PA-C 12/21/2019 7:19 PM     [facility] [address] [place] [address] [phone] (office)  [phone] (fax)

## 2019-12-23 ENCOUNTER — Telehealth: Payer: Self-pay | Admitting: *Deleted

## 2019-12-23 NOTE — Telephone Encounter (Signed)
Lvm for patient to call clinic back about symptoms to determine if office visit is needed

## 2019-12-24 ENCOUNTER — Ambulatory Visit: Payer: Self-pay | Admitting: Physician Assistant

## 2020-01-15 NOTE — Telephone Encounter (Signed)
[employer] has not received monitor back from patient. They have attempted to contact the patient on 3 occasions to request monitor be returned to [employer].  CANCELLED ORDER 

## 2020-08-07 IMAGING — CR CHEST - 2 VIEW
2 series · 2 of 2 positions shown · non-contrast
Comparison: October 24, 2011

CLINICAL DATA: Chest pain

EXAM:
CHEST - 2 VIEW

[chest pa]
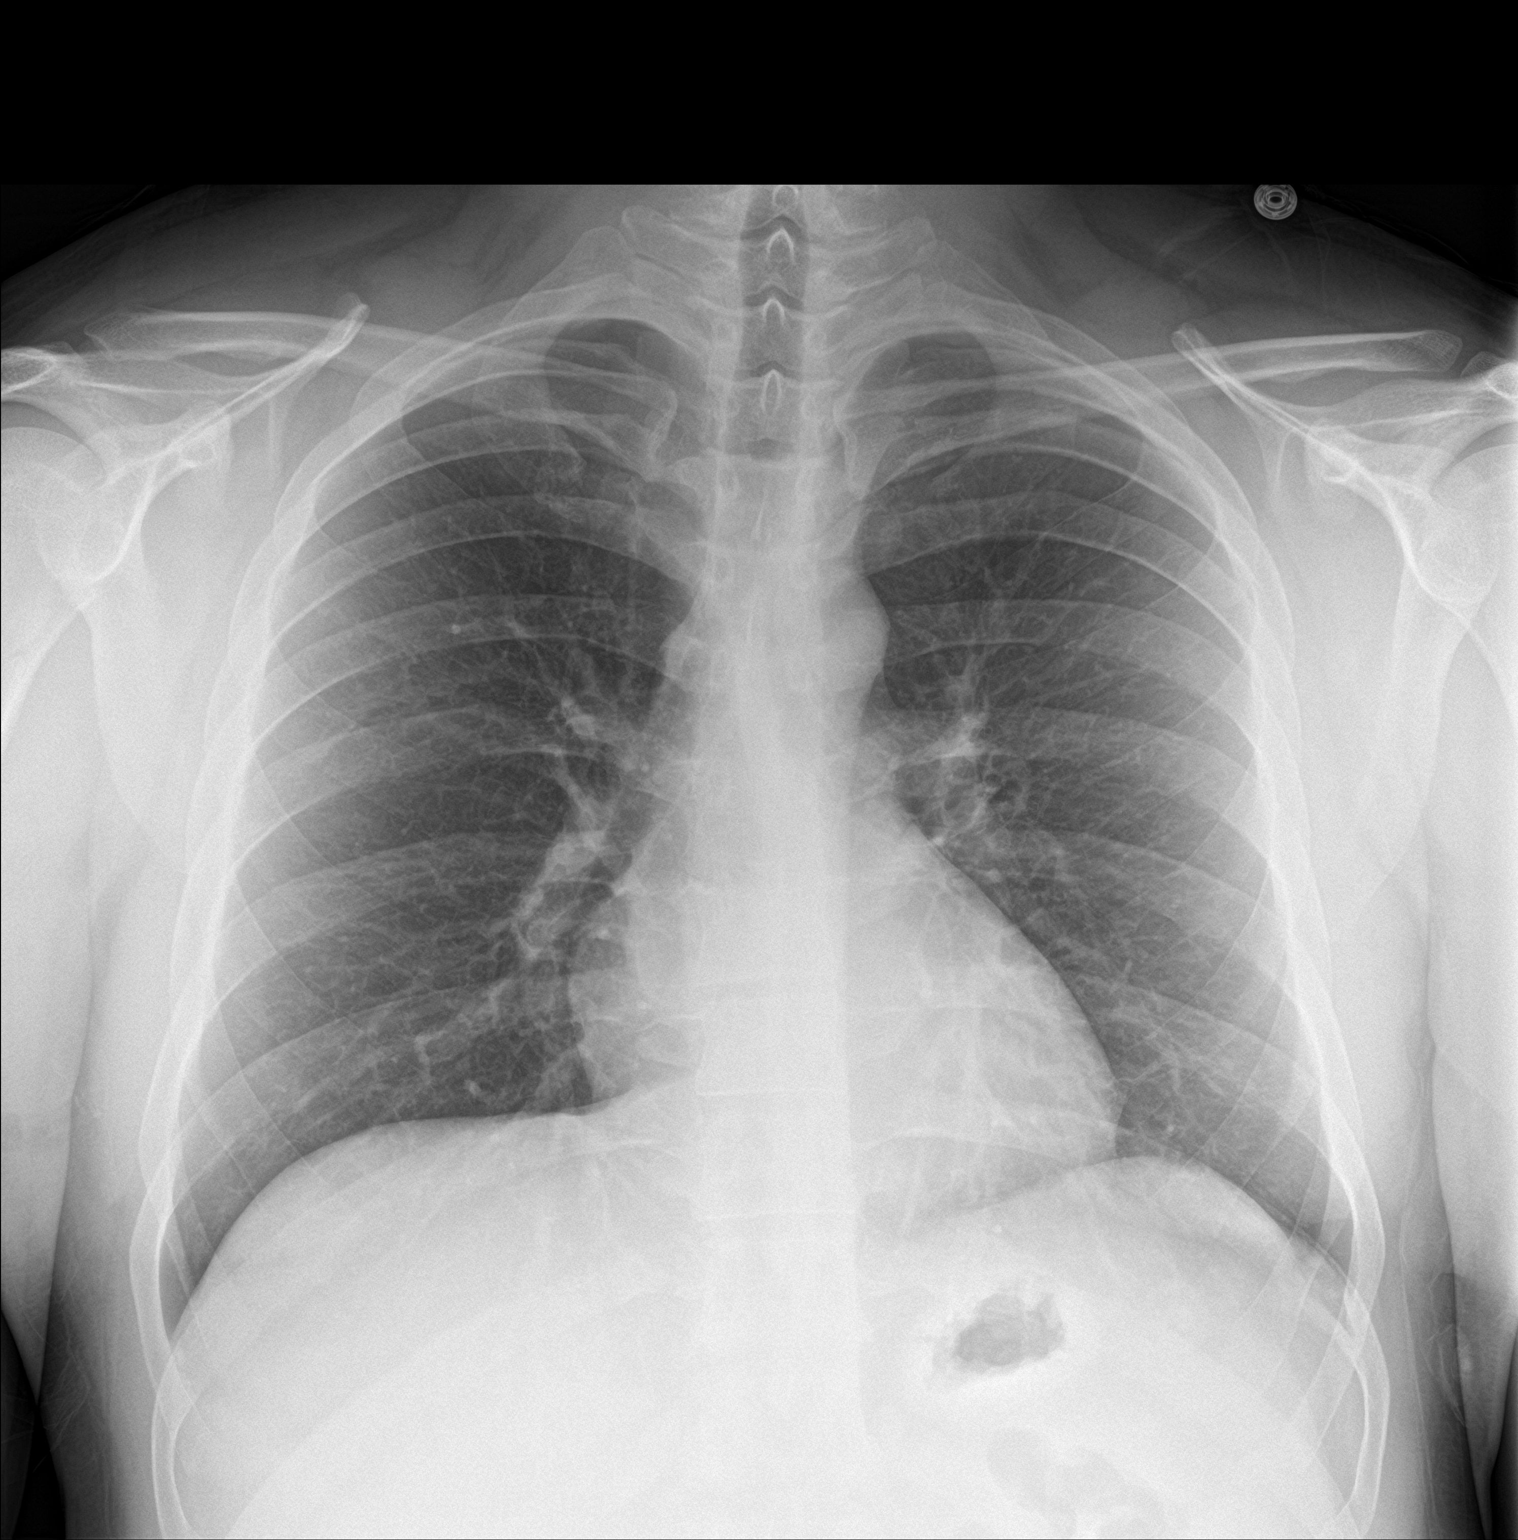

[chest lat]
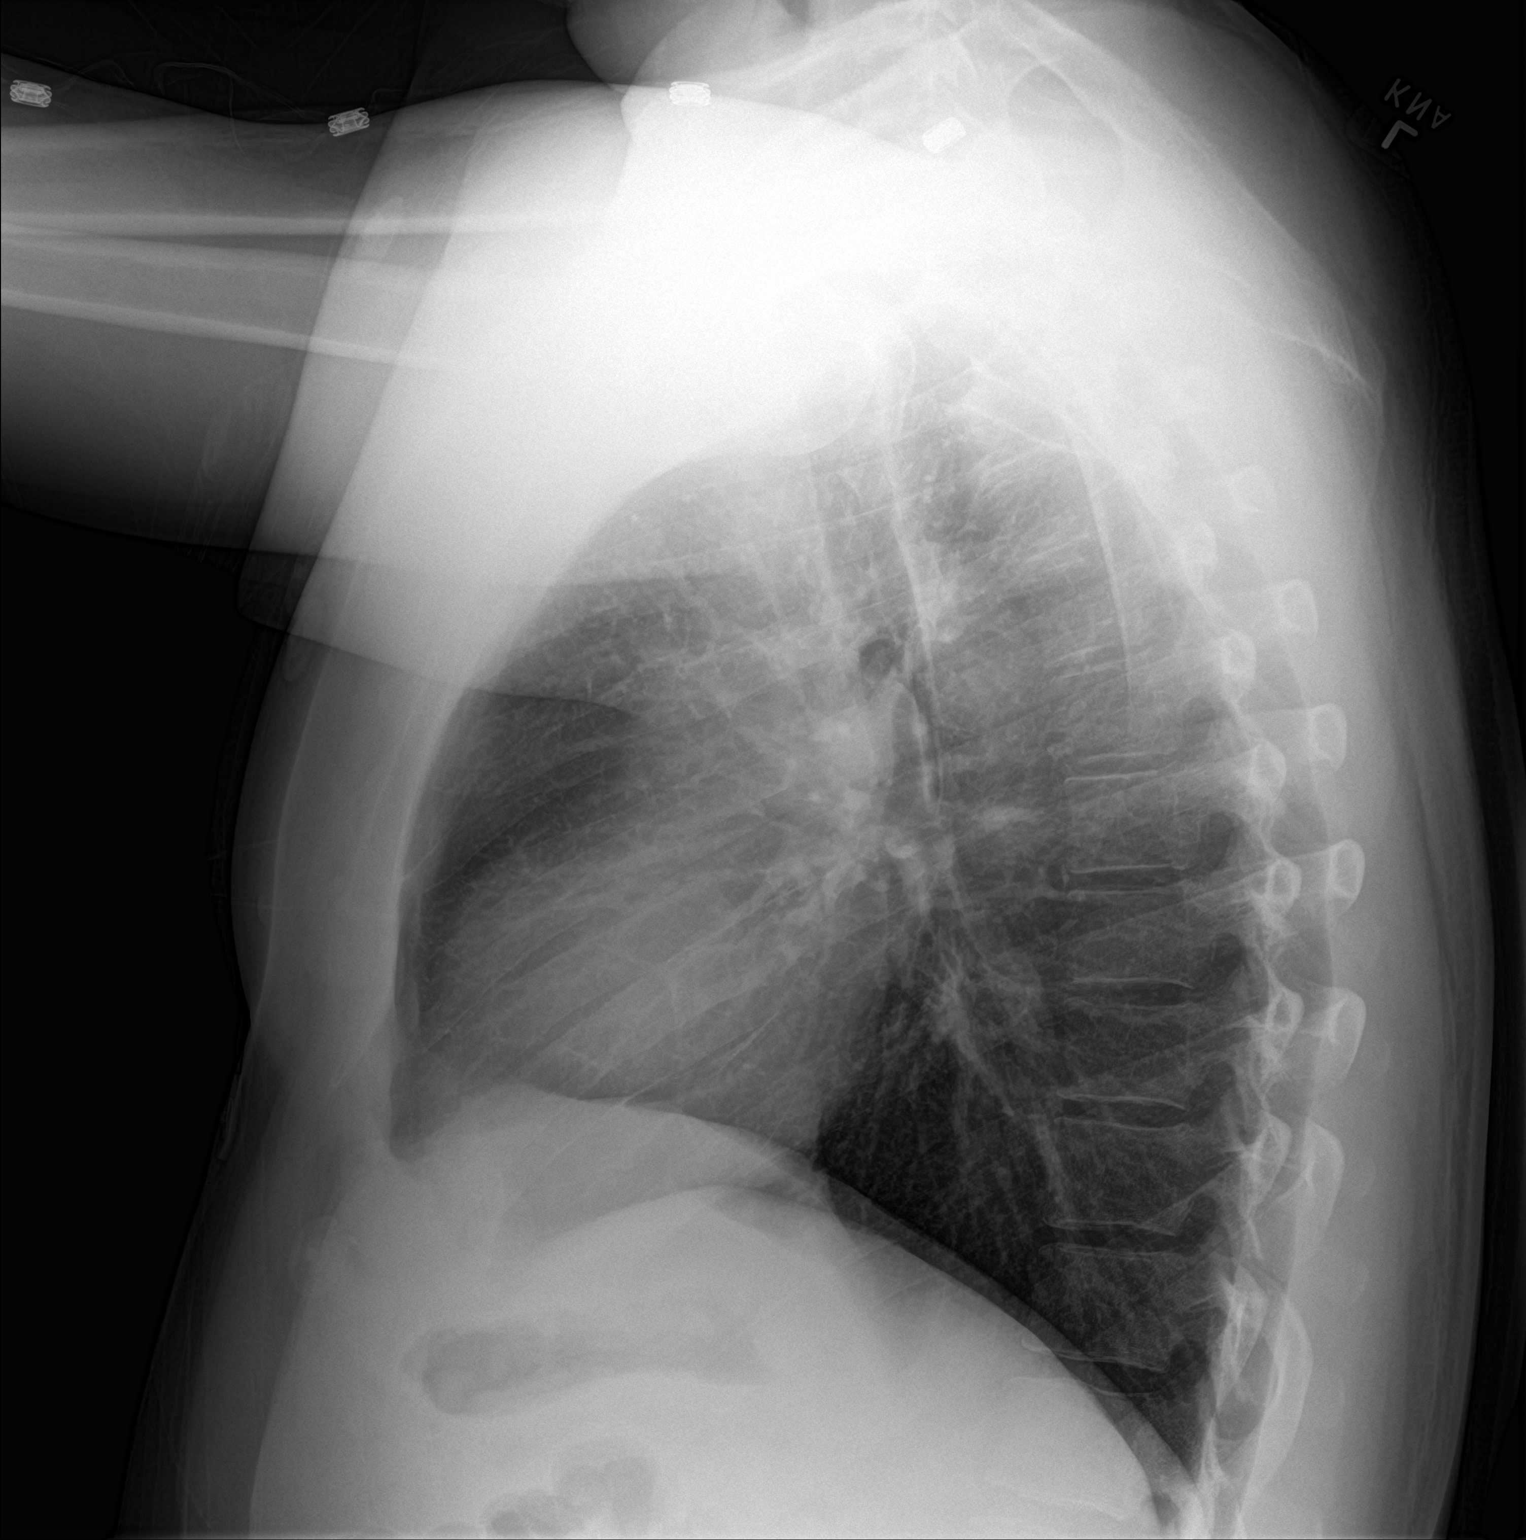

[2 of 2 positions shown; findings below may reference images not displayed]

FINDINGS: Lungs are clear. Heart size and pulmonary vascularity are normal. No
adenopathy. No pneumothorax. No bone lesions.
IMPRESSION: No edema or consolidation.

## 2020-09-07 ENCOUNTER — Telehealth: Payer: Self-pay | Admitting: Oncology

## 2020-09-07 NOTE — Telephone Encounter (Signed)
Re: [facility]  Called to Discuss with patient about Covid symptoms and the use of regeneron, a monoclonal antibody infusion for those with mild to moderate Covid symptoms and at a high risk of hospitalization.     Pt is qualified for this infusion at the [facility] infusion center due to co-morbid conditions and/or a member of an at-risk group.    Past Medical History:  Diagnosis Date  . ACL (anterior cruciate ligament) rupture, left 10/30/2011  . Acute lateral meniscus tear of left knee 10/30/2011  . Acute medial meniscus tear of left knee 10/30/2011  . DCM (dilated cardiomyopathy) ([facility])    EF 37% by Echo 07/19/11  . Hypertension   . PVC's (premature ventricular contractions)    s/p EPS and ablation 08/10/11 at [facility] and 03/08/12 at [facility], third ablation performed at [facility] by Dr Lavone Neri 11/14  . Torn ACL     Specific risk condition-HTN   Unable to reach pt. Left VM and MCM.  Mignon Pine, AGNP-C [phone] (Infusion Center Hotline)

## 2020-11-23 ENCOUNTER — Telehealth: Payer: Self-pay | Admitting: Internal Medicine

## 2020-11-23 NOTE — Telephone Encounter (Signed)
Pt c/o of Chest Pain: STAT if CP now or developed within 24 hours  1. Are you having CP right now? No.  2. Are you experiencing any other symptoms (ex. SOB, nausea, vomiting, sweating)? SOB  3. How long have you been experiencing CP? About a month.  4. Is your CP continuous or coming and going? Coming and going.  5. Have you taken Nitroglycerin? No.   Patient states that he's been experiencing chest discomfort and cant sleep. He states that he doesn't know if its heart burn or something more serious. Please advise.  ?

## 2020-11-23 NOTE — Telephone Encounter (Addendum)
Patients chest pain (pressure and tightness) that he has had in the past at night time has "ramped back up with the new year," and wants to be checked out because its been awhile. Doesn't appear to have changed compared to past notes in regards to S&S that occur only at night.   Of note: Coronary CT scan score 0 on 11/28/19.  Monitor never worn therefore follow up was canceled. Patient having GI office visit today  Talked about previous heart monitor that was to be worn and follow up. Determined that the patients address was incorrect and this is why he never received the heart monitor to wear and return. Patient is over due for follow up. Will schedule and check with Renee to see if he needs to do anything different before follow up.   Made appointment 12/08/20, updated address, and ED precautions given. Patient verbalized understanding.

## 2020-11-25 NOTE — Telephone Encounter (Signed)
MD Allred okay with plan.

## 2020-12-05 NOTE — Progress Notes (Signed)
Cardiology Office Note Date:  12/05/2020  Patient ID:  Kevin Simpson [birth year], MRN [id number] PCP:  Dan Maker., MD  Electrophysiologist  Kevin Simpson    Chief Complaint:  CP  History of Present Illness: Kevin Simpson is a 35 y.o. male with history of DCM, PVCs, polysubstance abuse, HLD.  He comes in today to be seen for Kevin Simpson, last seen by him Sept 2019. At that time doing well, no symptoms.  Kevin Simpson mentioning his PVCs resolved, h/o atypical CP/SOB that had negative w/u, and advised weight loss, exercise, counseled on polysubstance abuse.  He had an ER visit Aug 2020 for CP, he admitted to have been drinking a lot the weekend and had also used cocaine.  By his symptom description and a neg Trop, with low suspicion of ACS and symptoms likely 2/2 his weekend binge, was discharged from the ER.   I saw him 10/13/2019 He comes today with a number of concerns/symptoms.  He mentions CP, this is predominantly felt at night, associated with palpitations with a feeling some times that his heart is slow and others feels fast.  These wake him and are quite anxiety provoking.  He and his fiance recently had a baby, and will notice when up at night to feed her, he will feels central chest pain.  These are all a feeling of tihgness, non radiating, some associated with feeling SOB, some not. These are not noted day time, not exertional.  He does no exercise, and minimal physical activity, likes to golf when he can, at his level of day time exertional he does not have CP. He is very sedentary, works at home, desk job, and just doesn't exercise otherwise. No near syncope or syncope.  These symptoms date back over a year, recalls discussing them with Kevin Simpson and prompting last years evaluation.  Though he feels like they are worse, more often, almost every night and more worrisome to him. He denies any known family cardiac history, he thinks his cholesterol has been high in the  past, never on medicines. He denies smoking cigarettes, but will smoke a cigar occassionally (does not inhale).  He continues to drink, denies nightly drinking, will drink weekends and does occasionally use cocaine still. In d/w him about his ER visit in August, he admitted to long weekend of significant ETOH intake and by Monday "felt the worst I have ever felt" Planned for CTA, labs, monitor counseled on ETOH drugs  CT noted no CAD, Ca++ zero LFTs elevated, Tigs 251 HDL 36 LDL 201 Advised heart healthy diet eliminate ETOH and recheck LFTs (not done, cancelled 2 f/u appts) Monitor ordered, not done  Had COVID November 2021  TODAY He is accompanied by his fiance. He reports for about 25mo, but more so in the last 41mo return of CP. Most predictable at night, has seen GI and prescribed prliseoc for GERD and this has helped, but not completely resolved.  He has much difficulty in describing his symptom. Is L chest, "uncomfortable" not pain, sometimes with a sense that his heart beat is slow or fast, no associated near syncope or syncope.in the last couple months, most nights, but again prilosec seems to be helping some. He thinks anxiety may play a role, gets very worried and anxious about it and then tends to get hyepr focused and worried about it.  Keeps him from sleeping well  He mentions feeling breathless of late as well, generally.  More so with  exertional, but wearing mask sometimes make him feel like he can't breath well.  Neither of them have attached his symptoms to his COVID infection which he said was mildly symptomatic.   He continues to drink and use cocaine. He does not drink daily, but will say intermittently drink very heavily Uses cocaine perhaps 2x/month   Past Medical History:  Diagnosis Date   ACL (anterior cruciate ligament) rupture, left 10/30/2011   Acute lateral meniscus tear of left knee 10/30/2011   Acute medial meniscus tear of left knee 10/30/2011   DCM  (dilated cardiomyopathy) (HCC)    EF 37% by Echo 07/19/11   Hypertension    PVC's (premature ventricular contractions)    s/p EPS and ablation 08/10/11 at [facility] and 03/08/12 at [facility], third ablation performed at [facility] by Dr Lavone Neri 11/14   Torn ACL     Past Surgical History:  Procedure Laterality Date   ANTERIOR CRUCIATE LIGAMENT REPAIR  10/30/2011   Procedure: RECONSTRUCTION ANTERIOR CRUCIATE LIGAMENT (ACL);  Surgeon: Eulas Post;  Location: [facility] OR;  Service: Orthopedics;;   EP study and radiofrequency ablation  08/10/11, 03/08/12, 09/03/13   PVC ablation   MENISECTOMY  10/30/2011   Procedure: MENISECTOMY;  Surgeon: Eulas Post;  Location: [facility] OR;  Service: Orthopedics;  Laterality: Left;  medial and lateral   WISDOM TOOTH EXTRACTION      Current Outpatient Medications  Medication Sig Dispense Refill   ibuprofen (ADVIL) 200 MG tablet Take 400 mg by mouth every 6 (six) hours as needed for moderate pain.     metoprolol tartrate (LOPRESSOR) 100 MG tablet Take 1 tablet (100 mg total) by mouth once for 1 dose. Two hours prior to procedure 1 tablet 0   No current facility-administered medications for this visit.    Allergies:   Patient has no known allergies.   Social History:  The patient  reports that he has never smoked. He has never used smokeless tobacco. He reports current alcohol use. He reports current drug use. Drugs: Marijuana and Cocaine.   Family History:  The patient's family history includes Cancer in his paternal grandfather.  ROS:  Please see the history of present illness.    All other systems are reviewed and otherwise negative.   PHYSICAL EXAM:  VS:  There were no vitals taken for this visit. BMI: There is no height or weight on file to calculate BMI. Well nourished, well developed, in no acute distress  HEENT: normocephalic, atraumatic  Neck: no JVD, carotid bruits or masses Cardiac:  RRR; no significant  murmurs, no rubs, or gallops Lungs:  CTA b/l, no wheezing, rhonchi or rales  Abd: soft, nontender MS: no deformity or atrophy Ext: no edema  Skin: warm and dry, no rash Neuro:  No gross deficits appreciated Psych: euthymic mood, full affect    EKG:  Done today and reviewed by myself: Reviewed with DOD SR 73bpm, Q waves are more prominent, T changes III, aVf, non-specific   11/28/2019: Coronary CTa IMPRESSION: 1. No evidence of CAD, CADRADS = 0. 2. Coronary calcium score of 0. This was 0 percentile for age and sex matched control. 3. Normal coronary origin with left dominance.    03/12/2018: ETT  Blood pressure demonstrated a normal response to exercise.  There was no ST segment deviation noted during stress.   Normal ETT Normal hemodynamic response Good exercise tolerance 11.9 METS     03/12/2018: TTE Study Conclusions - Left ventricle: The  cavity size was normal. Wall thickness was   normal. Systolic function was normal. The estimated ejection   fraction was in the range of 50% to 55%. Wall motion was normal;   there were no regional wall motion abnormalities. Left   ventricular diastolic function parameters were normal. - Aortic valve: There was trivial regurgitation. - Mitral valve: Calcified annulus. There was mild regurgitation.  Impressions: - Normal LV function; trace AI; mild MR.    May 2019, 48hour monitor Sinus rhythm Rare premature atrial contractions, no premature ventricular contractions Average heart rate 72 bpm Nocturnal bradycardia noted No sustained arrhythmias No AV block or pause episodes    Recent Labs: No results found for requested labs within last 8760 hours.  No results found for requested labs within last 8760 hours.   CrCl cannot be calculated (Patient's most recent lab result is older than the maximum 21 days allowed.).   Wt Readings from Last 3 Encounters:  10/13/19 240 lb (108.9 kg)  05/26/19 230 lb (104.3 kg)   04/24/18 230 lb (104.3 kg)     Other studies reviewed: Additional studies/records reviewed today include: summarized above  ASSESSMENT AND PLAN:  1. PVCs     H/o 3 PVC ablations, last 2014     None on todays EKG     somemention of palpitations  2. Polysubstance abuse     Re-counseled at length today again  3. HLD     Discussed again today as well     CMET, check his LFTs  4. CP, SOB     typical sounding     Discussed briefly with DOD and reviewed EKG, given CT a year ago with Ca++ zero and NO CAD,  urge to quit using cocaine and ETOH abuse.      Agrees with plan for echo and follow up    Disposition: echo, monitoring as above, RTC 4-6 weeks, sooner if needed  Current medicines are reviewed at length with the patient today.  The patient did not have any concerns regarding medicines.  Norma Fredrickson, PA-C 12/05/2020 4:31 PM     [facility] [address] [place] [address] [phone] (office)  [phone] (fax)

## 2020-12-08 ENCOUNTER — Other Ambulatory Visit: Payer: Self-pay

## 2020-12-08 ENCOUNTER — Ambulatory Visit (INDEPENDENT_AMBULATORY_CARE_PROVIDER_SITE_OTHER): Payer: Self-pay

## 2020-12-08 ENCOUNTER — Ambulatory Visit (INDEPENDENT_AMBULATORY_CARE_PROVIDER_SITE_OTHER): Payer: Self-pay | Admitting: Physician Assistant

## 2020-12-08 ENCOUNTER — Encounter: Payer: Self-pay | Admitting: Radiology

## 2020-12-08 ENCOUNTER — Encounter: Payer: Self-pay | Admitting: Physician Assistant

## 2020-12-08 VITALS — BP 134/88 | HR 73 | Ht 73.0 in | Wt 248.2 lb

## 2020-12-08 DIAGNOSIS — R0602 Shortness of breath: Secondary | ICD-10-CM

## 2020-12-08 DIAGNOSIS — E785 Hyperlipidemia, unspecified: Secondary | ICD-10-CM

## 2020-12-08 DIAGNOSIS — R002 Palpitations: Secondary | ICD-10-CM

## 2020-12-08 DIAGNOSIS — R079 Chest pain, unspecified: Secondary | ICD-10-CM

## 2020-12-08 NOTE — Progress Notes (Signed)
Enrolled patient for a 14 day Zio XT Monitor to be mailed to patients home  

## 2020-12-08 NOTE — Patient Instructions (Addendum)
Medication Instructions:   Your physician recommends that you continue on your current medications as directed. Please refer to the Current Medication list given to you today.  *If you need a refill on your cardiac medications before your next appointment, please call your pharmacy*   Lab Work:  CMET TODAY   If you have labs (blood work) drawn today and your tests are completely normal, you will receive your results only by: Marland Kitchen MyChart Message (if you have MyChart) OR . A paper copy in the mail If you have any lab test that is abnormal or we need to change your treatment, we will call you to review the results.   Testing/Procedures: Your physician has requested that you have an echocardiogram. Echocardiography is a painless test that uses sound waves to create images of your heart. It provides your doctor with information about the size and shape of your heart and how well your heart's chambers and valves are working. This procedure takes approximately one hour. There are no restrictions for this procedure.  Your physician has recommended that you wear an event monitor. Event monitors are medical devices that record the heart's electrical activity. Doctors most often [place] these monitors to diagnose arrhythmias. Arrhythmias are problems with the speed or rhythm of the heartbeat. The monitor is a small, portable device. You can wear one while you do your normal daily activities. This is usually used to diagnose what is causing palpitations/syncope (passing out).   Follow-Up: At [facility], you and your health needs are our priority.  As part of our continuing mission to provide you with exceptional heart care, we have created designated Provider Care Teams.  These Care Teams include your primary Cardiologist (physician) and Advanced Practice Providers (APPs -  Physician Assistants and Nurse Practitioners) who all work together to provide you with the care you need, when you need it.  We  recommend signing up for the patient portal called "MyChart".  Sign up information is provided on this After Visit Summary.  MyChart is used to connect with patients for Virtual Visits (Telemedicine).  Patients are able to view lab/test results, encounter notes, upcoming appointments, etc.  Non-urgent messages can be sent to your provider as well.   To learn more about what you can do with MyChart, go to [email or web address].    Your next appointment:   3-4  week(s)  The format for your next appointment:   In Person  Provider:   [name], MD or Keitha Butte    Other Instructions  Christena Deem- Long Term Monitor Instructions   Your physician has requested you wear your ZIO patch monitor___14____days.   This is a single patch monitor.  [employer] supplies one patch monitor per enrollment.  Additional stickers are not available.   Please do not apply patch if you will be having a Nuclear Stress Test, Echocardiogram, Cardiac CT, MRI, or Chest Xray during the time frame you would be wearing the monitor. The patch cannot be worn during these tests.  You cannot remove and re-apply the ZIO XT patch monitor.   Your ZIO patch monitor will be sent USPS Priority mail from [facility] directly to your home address. The monitor may also be mailed to a PO BOX if home delivery is not available.   It may take 3-5 days to receive your monitor after you have been enrolled.   Once you have received you monitor, please review enclosed instructions.  Your monitor has already been registered assigning a specific monitor  serial # to you.   Applying the monitor   Shave hair from upper left chest.   Hold abrader disc by orange tab.  Rub abrader in 40 strokes over left upper chest as indicated in your monitor instructions.   Clean area with 4 enclosed alcohol pads .  Use all pads to assure are is cleaned thoroughly.  Let dry.   Apply patch as indicated in monitor instructions.  Patch will be place under  collarbone on left side of chest with arrow pointing upward.   Rub patch adhesive wings for 2 minutes.Remove white label marked "1".  Remove white label marked "2".  Rub patch adhesive wings for 2 additional minutes.   While looking in a mirror, press and release button in center of patch.  A small green light will flash 3-4 times .  This will be your only indicator the monitor has been turned on.     Do not shower for the first 24 hours.  You may shower after the first 24 hours.   Press button if you feel a symptom. You will hear a small click.  Record Date, Time and Symptom in the Patient Log Book.   When you are ready to remove patch, follow instructions on last 2 pages of Patient Log Book.  Stick patch monitor onto last page of Patient Log Book.   Place Patient Log Book in [place] box.  Use locking tab on box and tape box closed securely.  The [employer] and [employer] has [employer] on it.  Please place in mailbox as soon as possible.  Your physician should have your test results approximately 7 days after the monitor has been mailed back to [facility].   Call [facility] Customer Care at [phone] if you have questions regarding your ZIO XT patch monitor.  Call them immediately if you see an orange light blinking on your monitor.   If your monitor falls off in less than 4 days contact our Monitor department at [phone].  If your monitor becomes loose or falls off after 4 days call [employer] at [phone] for suggestions on securing your monitor.

## 2020-12-09 LAB — COMPREHENSIVE METABOLIC PANEL
ALT: 103 IU/L — ABNORMAL HIGH (ref 0–44)
AST: 40 IU/L (ref 0–40)
Albumin/Globulin Ratio: 2 (ref 1.2–2.2)
Albumin: 5.1 g/dL — ABNORMAL HIGH (ref 4.0–5.0)
Alkaline Phosphatase: 73 IU/L (ref 44–121)
BUN/Creatinine Ratio: 11 (ref 9–20)
BUN: 11 mg/dL (ref 6–20)
Bilirubin Total: 0.3 mg/dL (ref 0.0–1.2)
CO2: 20 mmol/L (ref 20–29)
Calcium: 9.9 mg/dL (ref 8.7–10.2)
Chloride: 102 mmol/L (ref 96–106)
Creatinine, Ser: 0.97 mg/dL (ref 0.76–1.27)
GFR calc Af Amer: 118 mL/min/{1.73_m2} (ref >59)
GFR calc non Af Amer: 102 mL/min/{1.73_m2} (ref >59)
Globulin, Total: 2.5 g/dL (ref 1.5–4.5)
Glucose: 95 mg/dL (ref 65–99)
Potassium: 4.2 mmol/L (ref 3.5–5.2)
Sodium: 142 mmol/L (ref 134–144)
Total Protein: 7.6 g/dL (ref 6.0–8.5)

## 2020-12-10 ENCOUNTER — Other Ambulatory Visit: Payer: Self-pay | Admitting: *Deleted

## 2020-12-13 ENCOUNTER — Other Ambulatory Visit: Payer: Self-pay | Admitting: *Deleted

## 2020-12-13 DIAGNOSIS — I420 Dilated cardiomyopathy: Secondary | ICD-10-CM

## 2020-12-13 DIAGNOSIS — E785 Hyperlipidemia, unspecified: Secondary | ICD-10-CM

## 2020-12-14 DIAGNOSIS — R002 Palpitations: Secondary | ICD-10-CM

## 2020-12-20 ENCOUNTER — Ambulatory Visit (HOSPITAL_COMMUNITY): Payer: Self-pay | Attending: Cardiology

## 2020-12-20 ENCOUNTER — Other Ambulatory Visit: Payer: Self-pay

## 2020-12-20 ENCOUNTER — Other Ambulatory Visit: Payer: Self-pay | Admitting: *Deleted

## 2020-12-20 DIAGNOSIS — R002 Palpitations: Secondary | ICD-10-CM

## 2020-12-20 DIAGNOSIS — E785 Hyperlipidemia, unspecified: Secondary | ICD-10-CM

## 2020-12-20 DIAGNOSIS — I420 Dilated cardiomyopathy: Secondary | ICD-10-CM

## 2020-12-20 DIAGNOSIS — R079 Chest pain, unspecified: Secondary | ICD-10-CM

## 2020-12-20 DIAGNOSIS — R0602 Shortness of breath: Secondary | ICD-10-CM

## 2020-12-20 LAB — ECHOCARDIOGRAM COMPLETE
Area-P 1/2: 3.85 cm2
S' Lateral: 3.2 cm

## 2020-12-21 ENCOUNTER — Other Ambulatory Visit: Payer: Self-pay | Admitting: *Deleted

## 2020-12-21 LAB — HEPATIC FUNCTION PANEL
ALT: 104 IU/L — ABNORMAL HIGH (ref 0–44)
AST: 44 IU/L — ABNORMAL HIGH (ref 0–40)
Albumin: 5.1 g/dL — ABNORMAL HIGH (ref 4.0–5.0)
Alkaline Phosphatase: 74 IU/L (ref 44–121)
Bilirubin Total: 0.6 mg/dL (ref 0.0–1.2)
Bilirubin, Direct: 0.13 mg/dL (ref 0.00–0.40)
Total Protein: 7.5 g/dL (ref 6.0–8.5)

## 2020-12-21 LAB — LIPID PANEL
Chol/HDL Ratio: 8.7 ratio — ABNORMAL HIGH (ref 0.0–5.0)
Cholesterol, Total: 312 mg/dL — ABNORMAL HIGH (ref 100–199)
HDL: 36 mg/dL — ABNORMAL LOW (ref >39)
LDL Chol Calc (NIH): 233 mg/dL — ABNORMAL HIGH (ref 0–99)
Triglycerides: 214 mg/dL — ABNORMAL HIGH (ref 0–149)
VLDL Cholesterol Cal: 43 mg/dL — ABNORMAL HIGH (ref 5–40)

## 2021-01-03 ENCOUNTER — Other Ambulatory Visit (HOSPITAL_COMMUNITY): Payer: Self-pay

## 2021-01-03 ENCOUNTER — Telehealth: Payer: Self-pay | Admitting: Internal Medicine

## 2021-01-03 NOTE — Telephone Encounter (Signed)
Spoke with patient again about lab echo results to reassure echo results are good. Patient has concerns about past heart attack. Patient was encourage to follow up with Dr Johney Frame for further recommendations  per [facility]

## 2021-01-03 NOTE — Telephone Encounter (Signed)
Patient calling for his echo results. He states he has not spoken with anyone yet.

## 2021-01-12 ENCOUNTER — Ambulatory Visit: Payer: Self-pay | Admitting: Internal Medicine

## 2021-12-28 ENCOUNTER — Telehealth: Payer: Self-pay | Admitting: Internal Medicine

## 2021-12-28 NOTE — Telephone Encounter (Signed)
Called and spoke with the patient. He states that his BP has been elevated the past couple of days. BPs have been 150-170s/90-100s, HR has been 70-88 bpm. He states that he is not having any Sx other than some mild discomfort in his chest that is not associated with exertion. Denies having any palpitations. Patient states that he eats foods that are high in salt content. Instructed the patient to limit salt in his diet. Patient has a Hx of cocaine use, but states that he has not used lately. Normal echo in 12/2020 and normal Ca+ score in 11/2019. He is not taking his omeprazole. Patient educated on how to check BP. Patient has an appointment with Oda Kilts on 4/12. Instructed the patient to continue to monitor and let us know if he develops any Sx prior to that time. ?

## 2021-12-28 NOTE — Telephone Encounter (Signed)
Pt c/o BP issue: STAT if pt c/o blurred vision, one-sided weakness or slurred speech ? ?1. What are your last 5 BP readings?  ?159/98 ?156/98 ?175/91 ?156/96 ?154/98 ?144/104 ? ?2. Are you having any other symptoms (ex. Dizziness, headache, blurred vision, passed out)?  ?Patient states he is experiencing a lot of discomfort, but he is unable to describe the feeling. States he is not in any pain or having palpitations, but has just been uncomfortable causing difficulty sleeping. I scheduled him for 4/12 with Otilio Saber, PA. ? ?3. What is your BP issue?  ? ?Patient's BP has been elevated. ? ?

## 2022-01-20 NOTE — Progress Notes (Signed)
? ?PCP:  Kevin Simpson., MD ?Primary Cardiologist: Kevin Range, MD ?Electrophysiologist: Kevin Range, MD  ? ?Kevin Simpson is a 36 y.o. male seen today for Kevin Range, MD for routine electrophysiology followup.  Since last being seen in our clinic the patient reports doing OK. He has occasional palpitations, mostly at night. BP at home running 150-170s at times, but usually checking when he feels odd or off, and not at rest or quiet. Not very active. Prefers to avoid medicines.  he denies chest pain, dyspnea, PND, orthopnea, nausea, vomiting, dizziness, syncope, edema, weight gain, or early satiety. ? ?Past Medical History:  ?Diagnosis Date  ? ACL (anterior cruciate ligament) rupture, left 10/30/2011  ? Acute lateral meniscus tear of left knee 10/30/2011  ? Acute medial meniscus tear of left knee 10/30/2011  ? DCM (dilated cardiomyopathy) (HCC)   ? EF 37% by Echo 07/19/11  ? Hypertension   ? PVC's (premature ventricular contractions)   ? s/p EPS and [facility]10[facility] and 03/08/12 at [facility], third ablation performed at [facility] by Dr Kevin Simpson 11/14  ? Torn ACL   ? ?Past Surgical History:  ?Procedure Laterality Date  ? ANTERIOR CRUCIATE LIGAMENT REPAIR  10/30/2011  ? Procedure: RECONSTRUCTION ANTERIOR CRUCIATE LIGAMENT (ACL);  Surgeon: Kevin Simpson;  Location: [facility] OR;  Service: Orthopedics;;  ? EP study and radiofrequency ablation  08/10/11, 03/08/12, 09/03/13  ? PVC ablation  ? MENISECTOMY  10/30/2011  ? Procedure: MENISECTOMY;  Surgeon: Kevin Simpson;  Location: [facility] OR;  Service: Orthopedics;  Laterality: Left;  medial and lateral  ? WISDOM TOOTH EXTRACTION    ? ? ?Current Outpatient Medications  ?Medication Sig Dispense Refill  ? acetaminophen (TYLENOL) 325 MG tablet Take 325 mg by mouth as needed. (Patient not taking: Reported on 01/25/2022)    ? omeprazole (PRILOSEC) 40 MG capsule Take 40 mg by mouth daily. (Patient not taking: Reported on 01/25/2022)    ? ?No  current facility-administered medications for this visit.  ? ? ?No Known Allergies ? ?Social History  ? ?Socioeconomic History  ? Marital status: Single  ?  Spouse name: Not on file  ? Number of children: Not on file  ? Years of education: Not on file  ? Highest education level: Not on file  ?Occupational History  ? Occupation: furniture delivery  ?Tobacco Use  ? Smoking status: Never  ? Smokeless tobacco: Never  ?Vaping Use  ? Vaping Use: Never used  ?Substance and Sexual Activity  ? Alcohol use: Yes  ?  Comment: 3 times per week  ? Drug use: Yes  ?  Types: Marijuana, Cocaine  ?  Comment: marijuana not currentlt using  ? Sexual activity: Not on file  ?Other Topics Concern  ? Not on file  ?Social History Narrative  ? Not on file  ? ?Social Determinants of Health  ? ?Financial Resource Strain: Not on file  ?Food Insecurity: Not on file  ?Transportation Needs: Not on file  ?Physical Activity: Not on file  ?Stress: Not on file  ?Social Connections: Not on file  ?Intimate Partner Violence: Not on file  ? ? ? ?Review of Systems: ?All other systems reviewed and are otherwise negative except as noted above. ? ?Physical Exam: ?Vitals:  ? 01/25/22 1019  ?BP: 124/88  ?Pulse: 82  ?SpO2: 97%  ?Weight: 252 lb 6.4 oz (114.5 kg)  ?Height: 6\' 1"  (1.854 m)  ? ? ?GEN- The patient is well appearing, alert  and oriented x 3 today.   ?HEENT: normocephalic, atraumatic; sclera clear, conjunctiva pink; hearing intact; oropharynx clear; neck supple, no JVP ?Lymph- no cervical lymphadenopathy ?Lungs- Clear to ausculation bilaterally, normal work of breathing.  No wheezes, rales, rhonchi ?Heart- Regular rate and rhythm, no murmurs, rubs or gallops, PMI not laterally displaced ?GI- soft, non-tender, non-distended, bowel sounds present, no hepatosplenomegaly ?Extremities- no clubbing, cyanosis, or edema; DP/PT/radial pulses 2+ bilaterally ?MS- no significant deformity or atrophy ?Skin- warm and dry, no rash or lesion ?Psych- euthymic mood, full  affect ?Neuro- strength and sensation are intact ? ?EKG is ordered. Personal review of EKG from today shows NSR ? ?Additional studies reviewed include: ?Previous EP office notes.  ? ?Assessment and Plan: ? ?1. PVCs ?H/o 3 PVC ablations, last 2014 ?Todays EKG shows NSR at 82 bpm without PVCs ?Rare, mild palpitations ?Declines BB.  ?  ?2. Polysubstance abuse ?Re-counseled at length today again ?Pt states he is abstaining ?  ?3. HLD ?His LDL has been significantly elevated for years and he has declined management.  ?Start atorvastatin 20 mg daily.  ?Referral to lipid clinic again placed. His wife is present today and she assures [place] he will keep up. She was unaware of ongoing HLD.  ?  ?4. HTN ?Elevated at home, but stable today.  ?Declines daily medication, wants to try diet/exercise and more intentional monitoring.  ? ?5. Atypical CP ?History of same ?Normal Echo 12/2020 ?No CAD by previous CT FFR.  ?Stressed importance of avoiding substance abuse. ? ?Follow up with Dr. Lalla Simpson in 12 months. ?Follow up with Gen cards and Lipid Clinic to establish for his HLD  ? ?Kevin Freer, PA-C  ?01/25/22 ?10:39 AM  ?

## 2022-01-25 ENCOUNTER — Ambulatory Visit: Payer: 59 | Admitting: Student

## 2022-01-25 ENCOUNTER — Encounter: Payer: Self-pay | Admitting: Student

## 2022-01-25 VITALS — BP 124/88 | HR 82 | Ht 73.0 in | Wt 252.4 lb

## 2022-01-25 DIAGNOSIS — R002 Palpitations: Secondary | ICD-10-CM

## 2022-01-25 DIAGNOSIS — I493 Ventricular premature depolarization: Secondary | ICD-10-CM

## 2022-01-25 DIAGNOSIS — R0602 Shortness of breath: Secondary | ICD-10-CM | POA: Diagnosis not present

## 2022-01-25 DIAGNOSIS — R079 Chest pain, unspecified: Secondary | ICD-10-CM | POA: Diagnosis not present

## 2022-01-25 DIAGNOSIS — E785 Hyperlipidemia, unspecified: Secondary | ICD-10-CM | POA: Diagnosis not present

## 2022-01-25 LAB — COMPREHENSIVE METABOLIC PANEL
ALT: 111 IU/L — ABNORMAL HIGH (ref 0–44)
AST: 39 IU/L (ref 0–40)
Albumin/Globulin Ratio: 2.6 — ABNORMAL HIGH (ref 1.2–2.2)
Albumin: 5.2 g/dL — ABNORMAL HIGH (ref 4.0–5.0)
Alkaline Phosphatase: 68 IU/L (ref 44–121)
BUN/Creatinine Ratio: 13 (ref 9–20)
BUN: 13 mg/dL (ref 6–20)
Bilirubin Total: 0.4 mg/dL (ref 0.0–1.2)
CO2: 25 mmol/L (ref 20–29)
Calcium: 9.6 mg/dL (ref 8.7–10.2)
Chloride: 102 mmol/L (ref 96–106)
Creatinine, Ser: 0.98 mg/dL (ref 0.76–1.27)
Globulin, Total: 2 g/dL (ref 1.5–4.5)
Glucose: 94 mg/dL (ref 70–99)
Potassium: 4.7 mmol/L (ref 3.5–5.2)
Sodium: 141 mmol/L (ref 134–144)
Total Protein: 7.2 g/dL (ref 6.0–8.5)
eGFR: 104 mL/min/{1.73_m2} (ref >59)

## 2022-01-25 LAB — LIPID PANEL
Chol/HDL Ratio: 8.9 ratio — ABNORMAL HIGH (ref 0.0–5.0)
Cholesterol, Total: 313 mg/dL — ABNORMAL HIGH (ref 100–199)
HDL: 35 mg/dL — ABNORMAL LOW (ref >39)
LDL Chol Calc (NIH): 189 mg/dL — ABNORMAL HIGH (ref 0–99)
Triglycerides: 435 mg/dL — ABNORMAL HIGH (ref 0–149)
VLDL Cholesterol Cal: 89 mg/dL — ABNORMAL HIGH (ref 5–40)

## 2022-01-25 MED ORDER — ATORVASTATIN CALCIUM 20 MG PO TABS: 20.0000 mg | ORAL_TABLET | Freq: Every day | ORAL | 3 refills | Status: DC

## 2022-01-25 NOTE — Patient Instructions (Signed)
Medication Instructions:  ?Your physician has recommended you make the following change in your medication:  ? ?START: Atorvastatin 20mg  daily ? ?*If you need a refill on your cardiac medications before your next appointment, please call your pharmacy* ? ? ?Lab Work: ?TODAY: CMET, FLP ? ?If you have labs (blood work) drawn today and your tests are completely normal, you will receive your results only by: ?MyChart Message (if you have MyChart) OR ?A paper copy in the mail ?If you have any lab test that is abnormal or we need to change your treatment, we will call you to review the results. ? ? ?Follow-Up: ?At [facility], you and your health needs are our priority.  As part of our continuing mission to provide you with exceptional heart care, we have created designated Provider Care Teams.  These Care Teams include your primary Cardiologist (physician) and Advanced Practice Providers (APPs -  Physician Assistants and Nurse Practitioners) who all work together to provide you with the care you need, when you need it. ? ?We recommend signing up for the patient portal called "MyChart".  Sign up information is provided on this After Visit Summary.  MyChart is used to connect with patients for Virtual Visits (Telemedicine).  Patients are able to view lab/test results, encounter notes, upcoming appointments, etc.  Non-urgent messages can be sent to your provider as well.   ?To learn more about what you can do with MyChart, go to [facility].   ? ?Your next appointment:   ?1 year(s) ? ?The format for your next appointment:   ?In Person ? ?Provider:   ?[email or web address], MD  ? ?Important Information About Sugar ? ? ? ? ?  ?

## 2022-02-17 ENCOUNTER — Ambulatory Visit: Payer: 59 | Admitting: Pharmacist

## 2022-02-17 ENCOUNTER — Encounter: Payer: Self-pay | Admitting: Pharmacist

## 2022-02-17 DIAGNOSIS — E785 Hyperlipidemia, unspecified: Secondary | ICD-10-CM

## 2022-02-17 NOTE — Patient Instructions (Signed)
Work on the diet changes that we discussed  ?Limit fast food  ?Cut out soda ?Limit alcohol to 2 drinks ?Watch for added sugars ?Increase exercise ? ?Recheck labs 6/30 - lab opens at 7:15 ? ?Call me at [phone] ? ?Continue atorvastatin 20mg  daily ?

## 2022-02-17 NOTE — Progress Notes (Signed)
Patient ID: Kevin Simpson                 DOB: 1986-05-26                    MRN: [id number] ? ? ? ? ?HPI: ?Kevin Simpson is a 36 y.o. male patient of Dr. Lovena Neighbours referred to lipid clinic by Kevin Saber, PA. PMH is significant for dilated cardiomyopathy, PVC, HTN, previous substance abuse and HLD. CAC score of 0 in 2021. ? ?Patient prefers not to take medications. At last visit with Kevin Simpson, he was started on atorvastatin 20mg  due to hx of significantly elevated LDL-C and TG.  ? ?Patient presents today to lipid clinic. He admits that this is the first lipid clinic appointment he has showed up for. He is trying to get the opinions of everyone. He has seen a integrated medicine doctors in the past. States his dad is a "health nut" and 2 of his relatives are nurses and they have given him lectures. He wants to be around for his children and knows that he needs to make some changes. Admits that he does not drink all the time, sometimes will go 1 or 2 weeks without drinking alcohol, but he will have several drinks when he does. He was drinking soda, but stopped recently. Eats a lot of red meat (grass fed), wants to know my opinion on this. Is very busy and does not exercise much. Has started in the last month walking for about after he drops his son off at school and has enjoyed that. He is trying to pay more attention to sodium (his BP has been high). Eats fast food for breakfast.  ?He is taking atorvastatin 20mg . Noticed some more joint pains. Wants to know if these will go away, are the benefits worth the risks. ? ?Current Medications: atorvastatin 20mg  daily ?Intolerances:  ?Risk Factors: possible FH LDL-C >190. HTN ?LDL-C goal: <70 ?ApoB goal: <80 ?TG goal: <150 ? ?Diet: cooks dinner most nights ?Eats out for breakfast and lunch ?Usually eating out with clients ?Dinner: meat, veggies, sweet potatoes ? ?Exercise: trying to walk 30 min 3 x per week ? ?Family History:  ?Family History  ?Problem Relation  Age of Onset  ? Cancer Paternal Grandfather   ?     uncertain what kind  ? ? ?Social History: drinks alcohol- sometimes heavy  ? ?Labs: 01/25/22 TC 313, TG 435, HDL 35, LDL-C 189 (no therapy) ?12/20/20 TC 312, TG 214, HDL 36, LDL-C 233 (no therapy) ? ? ?Past Medical History:  ?Diagnosis Date  ? ACL (anterior cruciate ligament) rupture, left 10/30/2011  ? Acute lateral meniscus tear of left knee 10/30/2011  ? Acute medial meniscus tear of left knee 10/30/2011  ? DCM (dilated cardiomyopathy) (HCC)   ? EF 37% by Echo 07/19/11  ? Hypertension   ? PVC's (premature ventricular contractions)   ? s/p EPS and ablation 08/10/11 at [facility] and 03/08/12 at [facility], third ablation performed at [facility] by Dr 03/10/12 11/14  ? Torn ACL   ? ? ?Current Outpatient Medications on File Prior to Visit  ?Medication Sig Dispense Refill  ? acetaminophen (TYLENOL) 325 MG tablet Take 325 mg by mouth as needed. (Patient not taking: Reported on 01/25/2022)    ? atorvastatin (LIPITOR) 20 MG tablet Take 1 tablet (20 mg total) by mouth daily. 90 tablet 3  ? omeprazole (PRILOSEC) 40 MG capsule Take 40 mg by  mouth daily. (Patient not taking: Reported on 01/25/2022)    ? ?No current facility-administered medications on file prior to visit.  ? ? ?No Known Allergies ? ?Assessment/Plan: ? ?1. Hyperlipidemia - Patient TG and LDL are very elevated. Due to his hx of LDL >190, I suspect he could have FH. TG are also very elevated, most likely due to diet. We had a very extensive conversation about nutrition. Recommended that he limit alcohol to 2 drinks per occasion. He has already stopped drinking soda and I encouraged him to continue to abstain from soda/other sugary beverages. Stop eating fast food. Eat breakfast at home. Recommended eggs and ground [place] with seasoning. We did discuss the difficulty of studying nutrition and if it is the red meat itself or how the animal is raised and processed that causes heart disease.   I encouraged moderation.  ?We talked about the steps of arthrosclerosis and the 10 year risk calculator (which really misses the mark in younger patients) vs our 30 year risk. We need to start being proactive when we are young. Patient will continue atorvastatin 20mg  daily. Recheck labs 6/30. I doubt that his LDL will be at goal, butt given his hesitancy about medications, will keep with lifestyle for now and address additional medication when labs come back. ? ?Thank you, ? ? ?7/30, Pharm.D, BCPS, CPP ?[facility]  ?[address] [place], [place]  ?Phone: [phone]; Fax: [phone]  ? ? ?

## 2022-02-25 NOTE — Progress Notes (Signed)
?Cardiology Office Note:   ? ?Date:  02/27/2022  ? ?ID:  Kevin Simpson, DOB 03/27/1986, MRN [id number] ? ?PCP:  Dan Maker., MD ?  ?[facility] Providers ?Cardiologist:  [name], MD ?Electrophysiologist:  [name], MD    ? ?Referring MD: Dan Maker., MD  ? ?CC: HTN eval ?Consulted for the evaluation of dilated cardiomyopathy at the behest of Dan Maker., MD ? ?History of Present Illness:   ? ?Kevin Simpson is a 36 y.o. male with a hx of Dilated cardiomyopathy with frequent PVCs; three prior ablations last at [facility] 2014) and CMR of LVEF 40% with no LGE in 2012 with no family history of cardiomyopathy who presents for evaluation 02/27/22. Hx of HTN and FHX.  Hx of notable alcohol use. Saw lipid clinic 5/23 with plans to get follow up labs in June. ? ?Patient notes that he is doing good.   ?Started atorvastatin 01/25/22 ?Works in [employer] which is stressful and works 7 days a week. ?Has a 35 year old and a 35 year old and nights. ?Dealing a lot of stress. ?There are no interval hospital/ED visit.   ? ?No chest pain or pressure.  No SOB/DOE and no PND/Orthopnea.  No weight gain or leg swelling.  No palpitations or syncope.  Has been noting higher blood pressures. ? ?Stress is a big driver of his BP: he has brought on new people for this.Bought a new house which is stressful.  Going on walks which has helped. ? ?Ambulatory blood pressure as high as SBP 170 . ? ? ?Past Medical History:  ?Diagnosis Date  ? ACL (anterior cruciate ligament) rupture, left 10/30/2011  ? Acute lateral meniscus tear of left knee 10/30/2011  ? Acute medial meniscus tear of left knee 10/30/2011  ? DCM (dilated cardiomyopathy) (HCC)   ? EF 37% by Echo 07/19/11  ? Hypertension   ? PVC's (premature ventricular contractions)   ? s/p EPS and ablation 08/10/11 at [facility] and 03/08/12 at [facility], third ablation performed at [facility] by Dr Lavone Neri 11/14  ? Torn ACL   ? ? ?Past Surgical  History:  ?Procedure Laterality Date  ? ANTERIOR CRUCIATE LIGAMENT REPAIR  10/30/2011  ? Procedure: RECONSTRUCTION ANTERIOR CRUCIATE LIGAMENT (ACL);  Surgeon: Eulas Post;  Location: [facility] OR;  Service: Orthopedics;;  ? EP study and radiofrequency ablation  08/10/11, 03/08/12, 09/03/13  ? PVC ablation  ? MENISECTOMY  10/30/2011  ? Procedure: MENISECTOMY;  Surgeon: Eulas Post;  Location: [facility] OR;  Service: Orthopedics;  Laterality: Left;  medial and lateral  ? WISDOM TOOTH EXTRACTION    ? ? ?Current Medications: ?Current Meds  ?Medication Sig  ? acetaminophen (TYLENOL) 325 MG tablet Take 325 mg by mouth as needed.  ? atorvastatin (LIPITOR) 20 MG tablet Take 1 tablet (20 mg total) by mouth daily.  ? omeprazole (PRILOSEC) 40 MG capsule Take 40 mg by mouth as needed.  ?  ? ?Allergies:   Patient has no known allergies.  ? ?Social History  ? ?Socioeconomic History  ? Marital status: Single  ?  Spouse name: Not on file  ? Number of children: Not on file  ? Years of education: Not on file  ? Highest education level: Not on file  ?Occupational History  ? Occupation: furniture delivery  ?Tobacco Use  ? Smoking status: Never  ? Smokeless tobacco: Never  ?Vaping Use  ? Vaping Use: Never used  ?Substance and Sexual  Activity  ? Alcohol use: Yes  ?  Comment: 3 times per week  ? Drug use: Yes  ?  Types: Marijuana, Cocaine  ?  Comment: marijuana not currentlt using  ? Sexual activity: Not on file  ?Other Topics Concern  ? Not on file  ?Social History Narrative  ? Not on file  ? ?Social Determinants of Health  ? ?Financial Resource Strain: Not on file  ?Food Insecurity: Not on file  ?Transportation Needs: Not on file  ?Physical Activity: Not on file  ?Stress: Not on file  ?Social Connections: Not on file  ?  ? ?Family History: ?The patient's family history includes Cancer in his paternal grandfather. ?No HF hx ? ?ROS:   ?Please see the history of present illness.    ? All other systems reviewed and are negative. ? ?EKGs/Labs/Other  Studies Reviewed:   ? ?The following studies were reviewed today: ? ?Cardiac Event Monitoring: ?Date: 01/05/21 ?Results: ?Sinus rhythm ?PVCs remain resolved post ablation ?  ?Patient had a min HR of 39 bpm, max HR of 167 bpm, and avg HR of 79 bpm.  Bradycardia events appear to occur nocturnally ? ?Transthoracic Echocardiogram: ?Date: 12/20/20 ?Results: ? 1. Left ventricular ejection fraction, by estimation, is 55%. The left  ?ventricle has low normal function. The left ventricle has no regional wall  ?motion abnormalities. Left ventricular diastolic parameters were normal.  ?GLS -24.3%, normal.  ? 2. Right ventricular systolic function is normal. The right ventricular  ?size is normal. There is normal pulmonary artery systolic pressure. The  ?estimated right ventricular systolic pressure is 24.5 mmHg.  ? 3. The mitral valve is normal in structure. Trivial mitral valve  ?regurgitation. No evidence of mitral stenosis.  ? 4. The aortic valve is tricuspid. Aortic valve regurgitation is not  ?visualized. No aortic stenosis is present.  ? 5. The inferior vena cava is normal in size with greater than 50%  ?respiratory variability, suggesting right atrial pressure of 3 mmHg.  ? ? ?ECG or NM Stress Testing : ?Date: 03/12/18 ?Results: ?Blood pressure demonstrated a normal response to exercise. ?There was no ST segment deviation noted during stress. ?  ?Normal ETT ?Normal hemodynamic response ?Good exercise tolerance 11.9 METS  ? ? ?Recent Labs: ?01/25/2022: ALT 111; BUN 13; Creatinine, Ser 0.98; Potassium 4.7; Sodium 141  ?Recent Lipid Panel ?   ?Component Value Date/Time  ? CHOL 313 (H) 01/25/2022 1100  ? TRIG 435 (H) 01/25/2022 1100  ? HDL 35 (L) 01/25/2022 1100  ? CHOLHDL 8.9 (H) 01/25/2022 1100  ? LDLCALC 189 (H) 01/25/2022 1100  ? ?    ? ?Physical Exam:   ? ?VS:  BP 112/70   Pulse 73   Ht 6\' 1"  (1.854 m)   Wt 250 lb (113.4 kg)   SpO2 97%   BMI 32.98 kg/m?    ? ?Wt Readings from Last 3 Encounters:  ?02/27/22 250 lb (113.4  kg)  ?01/25/22 252 lb 6.4 oz (114.5 kg)  ?12/08/20 248 lb 3.2 oz (112.6 kg)  ? ? ?Gen: No distress  ?Neck: No JVD ?Ears: No Homero Fellers Sign ?Cardiac: No Rubs or Gallops, no Murmur, RRR, +2 radial pulses ?Respiratory: Clear to auscultation bilaterally, normal effort, normal  respiratory rate ?GI: Soft, nontender, non-distended  ?MS: No  edema;  moves all extremities ?Integument: Skin feels  ?Neuro:  At time of evaluation, alert and oriented to person/place/time/situation  ?Psych: Normal affect, patient feels stressed ? ? ?ASSESSMENT:   ? ?1. Essential hypertension   ?  2. Hyperlipidemia, unspecified hyperlipidemia type   ?3. PVC's (premature ventricular contractions)   ?4. Dilated cardiomyopathy (HCC)   ? ?PLAN:   ? ?Masked Hypertension ?Acute Stressor ?PVC dilated cardiomyopathy s/p PVC ablation (last 2014 [facility] with resolution and Lv recovery) ?-will start ambulatory BP monitoring; gave education on how to perform ambulatory blood pressure monitoring including the frequency and technique; goal ambulatory blood pressure < 130/80 on average ?- if persistently elevated not related to his job or move, we will trial propranolol 60 mg PO XL given his stressors, PVC hx and cardiomyopathy hx ?- adding - lipoprotein (a) to his 04/14/22 labs ?- continue lipitor 20 mg PO daily for now ? ?   ?Six months with me unless BP issues, then yearly me or APP ? ? ?Medication Adjustments/Labs and Tests Ordered: ?Current medicines are reviewed at length with the patient today.  Concerns regarding medicines are outlined above.  ?Orders Placed This Encounter  ?Procedures  ? Lipoprotein A (LPA)  ? ?No orders of the defined types were placed in this encounter. ? ? ?Patient Instructions  ?Medication Instructions:  ?Your physician recommends that you continue on your current medications as directed. Please refer to the Current Medication list given to you today. ? ?*If you need a refill on your cardiac medications before your next appointment, please  call your pharmacy* ? ? ?Lab Work: ?04/14/2022: lipoprotein a ?If you have labs (blood work) drawn today and your tests are completely normal, you will receive your results only by: ?MyChart Message (if you

## 2022-02-27 ENCOUNTER — Encounter: Payer: Self-pay | Admitting: Internal Medicine

## 2022-02-27 ENCOUNTER — Ambulatory Visit: Payer: 59 | Admitting: Internal Medicine

## 2022-02-27 VITALS — BP 112/70 | HR 73 | Ht 73.0 in | Wt 250.0 lb

## 2022-02-27 DIAGNOSIS — I10 Essential (primary) hypertension: Secondary | ICD-10-CM | POA: Insufficient documentation

## 2022-02-27 DIAGNOSIS — E785 Hyperlipidemia, unspecified: Secondary | ICD-10-CM | POA: Diagnosis not present

## 2022-02-27 DIAGNOSIS — I420 Dilated cardiomyopathy: Secondary | ICD-10-CM

## 2022-02-27 DIAGNOSIS — I493 Ventricular premature depolarization: Secondary | ICD-10-CM

## 2022-02-27 NOTE — Patient Instructions (Signed)
Medication Instructions:  ?Your physician recommends that you continue on your current medications as directed. Please refer to the Current Medication list given to you today. ? ?*If you need a refill on your cardiac medications before your next appointment, please call your pharmacy* ? ? ?Lab Work: ?04/14/2022: lipoprotein a ?If you have labs (blood work) drawn today and your tests are completely normal, you will receive your results only by: ?MyChart Message (if you have MyChart) OR ?A paper copy in the mail ?If you have any lab test that is abnormal or we need to change your treatment, we will call you to review the results. ? ? ?Testing/Procedures: ?NONE ? ? ?Follow-Up: ?At [facility], you and your health needs are our priority.  As part of our continuing mission to provide you with exceptional heart care, we have created designated Provider Care Teams.  These Care Teams include your primary Cardiologist (physician) and Advanced Practice Providers (APPs -  Physician Assistants and Nurse Practitioners) who all work together to provide you with the care you need, when you need it. ? ?We recommend signing up for the patient portal called "MyChart".  Sign up information is provided on this After Visit Summary.  MyChart is used to connect with patients for Virtual Visits (Telemedicine).  Patients are able to view lab/test results, encounter notes, upcoming appointments, etc.  Non-urgent messages can be sent to your provider as well.   ?To learn more about what you can do with MyChart, go to [email or web address].   ? ?Your next appointment:   ?6 month(s) ? ?The format for your next appointment:   ?In Person ? ?Provider:   ?Riley Lam, MD ? ? ?Other Instructions ?Please monitor your Blood pressure  ? ?Important Information About Sugar ? ? ? ? ?  ?

## 2022-04-14 ENCOUNTER — Other Ambulatory Visit: Payer: 59

## 2022-04-27 ENCOUNTER — Other Ambulatory Visit: Payer: 59

## 2022-04-27 DIAGNOSIS — E785 Hyperlipidemia, unspecified: Secondary | ICD-10-CM

## 2022-04-28 LAB — LIPID PANEL
Chol/HDL Ratio: 5.6 ratio — ABNORMAL HIGH (ref 0.0–5.0)
Cholesterol, Total: 180 mg/dL (ref 100–199)
HDL: 32 mg/dL — ABNORMAL LOW (ref >39)
LDL Chol Calc (NIH): 118 mg/dL — ABNORMAL HIGH (ref 0–99)
Triglycerides: 166 mg/dL — ABNORMAL HIGH (ref 0–149)
VLDL Cholesterol Cal: 30 mg/dL (ref 5–40)

## 2022-04-28 LAB — HEPATIC FUNCTION PANEL
ALT: 65 IU/L — ABNORMAL HIGH (ref 0–44)
AST: 26 IU/L (ref 0–40)
Albumin: 4.7 g/dL (ref 4.1–5.1)
Alkaline Phosphatase: 70 IU/L (ref 44–121)
Bilirubin Total: 0.5 mg/dL (ref 0.0–1.2)
Bilirubin, Direct: 0.11 mg/dL (ref 0.00–0.40)
Total Protein: 7 g/dL (ref 6.0–8.5)

## 2022-04-28 LAB — APOLIPOPROTEIN B: Apolipoprotein B: 120 mg/dL — ABNORMAL HIGH (ref <90)

## 2022-04-28 LAB — LIPOPROTEIN A (LPA): Lipoprotein (a): 73 nmol/L (ref <75.0)

## 2022-04-28 LAB — LDL CHOLESTEROL, DIRECT: LDL Direct: 124 mg/dL — ABNORMAL HIGH (ref 0–99)

## 2022-05-02 ENCOUNTER — Telehealth: Payer: Self-pay | Admitting: Pharmacist

## 2022-05-02 DIAGNOSIS — E785 Hyperlipidemia, unspecified: Secondary | ICD-10-CM

## 2022-05-02 NOTE — Telephone Encounter (Signed)
LDL-C and TG have improved but still high. ApoB high as well.  Will see if patient would be willing to increase atorvastatin or add ezetimibe.

## 2022-05-02 NOTE — Telephone Encounter (Signed)
I spoke with patient and reviewed his labs. He does not want to add more medication or increase his atorvastatin. He continues to work on diet. He doesn't realty want to be on the medication he is on. He is working hard on diet. Will repeat labs again in 3 months per pt request.

## 2022-08-14 ENCOUNTER — Other Ambulatory Visit: Payer: 59

## 2022-08-21 ENCOUNTER — Ambulatory Visit: Payer: 59 | Attending: Internal Medicine

## 2022-08-21 DIAGNOSIS — E785 Hyperlipidemia, unspecified: Secondary | ICD-10-CM

## 2022-08-22 LAB — LIPID PANEL
Chol/HDL Ratio: 5.9 ratio — ABNORMAL HIGH (ref 0.0–5.0)
Cholesterol, Total: 202 mg/dL — ABNORMAL HIGH (ref 100–199)
HDL: 34 mg/dL — ABNORMAL LOW (ref >39)
LDL Chol Calc (NIH): 125 mg/dL — ABNORMAL HIGH (ref 0–99)
Triglycerides: 239 mg/dL — ABNORMAL HIGH (ref 0–149)
VLDL Cholesterol Cal: 43 mg/dL — ABNORMAL HIGH (ref 5–40)

## 2022-08-22 LAB — HEPATIC FUNCTION PANEL
ALT: 76 IU/L — ABNORMAL HIGH (ref 0–44)
AST: 29 IU/L (ref 0–40)
Albumin: 5 g/dL (ref 4.1–5.1)
Alkaline Phosphatase: 79 IU/L (ref 44–121)
Bilirubin Total: 0.5 mg/dL (ref 0.0–1.2)
Bilirubin, Direct: 0.14 mg/dL (ref 0.00–0.40)
Total Protein: 6.8 g/dL (ref 6.0–8.5)

## 2022-08-22 LAB — APOLIPOPROTEIN B: Apolipoprotein B: 122 mg/dL — ABNORMAL HIGH (ref <90)

## 2022-08-23 ENCOUNTER — Telehealth: Payer: Self-pay | Admitting: Internal Medicine

## 2022-08-23 NOTE — Telephone Encounter (Signed)
Pt is returning call in regards to results. Transferred to [facility], Charity fundraiser.

## 2022-08-23 NOTE — Telephone Encounter (Signed)
Please see result note 

## 2022-08-25 ENCOUNTER — Ambulatory Visit: Payer: 59 | Admitting: Internal Medicine

## 2022-09-13 ENCOUNTER — Ambulatory Visit: Payer: 59 | Attending: Internal Medicine | Admitting: Internal Medicine

## 2022-09-13 ENCOUNTER — Encounter: Payer: Self-pay | Admitting: Internal Medicine

## 2022-09-13 ENCOUNTER — Ambulatory Visit: Payer: 59

## 2022-09-13 VITALS — BP 115/74 | HR 92 | Ht 73.0 in | Wt 263.0 lb

## 2022-09-13 DIAGNOSIS — I420 Dilated cardiomyopathy: Secondary | ICD-10-CM

## 2022-09-13 DIAGNOSIS — I493 Ventricular premature depolarization: Secondary | ICD-10-CM

## 2022-09-13 DIAGNOSIS — I10 Essential (primary) hypertension: Secondary | ICD-10-CM

## 2022-09-13 NOTE — Progress Notes (Unsigned)
Enrolled patient for a 7 day Zio XT monitor to be mailed to patients home.  

## 2022-09-13 NOTE — Progress Notes (Signed)
Cardiology Office Note:    Date:  09/13/2022   ID:  SAL SWEETEN, DOB 1986-06-01, MRN [id number]  PCP:  Myrtis Hopping., MD   [facility] Providers Cardiologist:  Thompson Grayer, MD Electrophysiologist:  Thompson Grayer, MD     Referring MD: Myrtis Hopping., MD   CC: Dilated Cardiomyopathy  History of Present Illness:    Kevin Simpson is a 36 y.o. male with a hx of Dilated cardiomyopathy with frequent PVCs; three prior ablations last at [facility] 2014) and CMR of LVEF 40% with no LGE in 2012 with no family history of cardiomyopathy who presents for evaluation 02/27/22. Hx of HTN and FHX.  Hx of notable alcohol use. Saw lipid clinic 5/23 with plans to get follow up labs in June. 2023: LDL back to normal  Patient notes that he is doing good.   LDL is back to normal. There are no interval hospital/ED visit.    No chest pain or pressure .  No SOB/DOE and no PND/Orthopnea.  No weight gain or leg swelling.  Worsening nocturnal palpitations.  Ambulatory blood pressure not done.   Past Medical History:  Diagnosis Date   ACL (anterior cruciate ligament) rupture, left 10/30/2011   Acute lateral meniscus tear of left knee 10/30/2011   Acute medial meniscus tear of left knee 10/30/2011   DCM (dilated cardiomyopathy) ([place])    EF 37% by Echo 07/19/11   Hypertension    PVC's (premature ventricular contractions)    s/p EPS and ablation 08/10/11 at [facility] and 03/08/12 at [facility], third ablation performed at [facility] by Dr Inda Castle 11/14   Torn ACL     Past Surgical History:  Procedure Laterality Date   ANTERIOR CRUCIATE LIGAMENT REPAIR  10/30/2011   Procedure: RECONSTRUCTION ANTERIOR CRUCIATE LIGAMENT (ACL);  Surgeon: Johnny Bridge;  Location: [place] OR;  Service: Orthopedics;;   EP study and radiofrequency ablation  08/10/11, 03/08/12, 09/03/13   PVC ablation   MENISECTOMY  10/30/2011   Procedure: MENISECTOMY;  Surgeon: Johnny Bridge;  Location: [place];  Service: Orthopedics;  Laterality: Left;  medial and lateral   WISDOM TOOTH EXTRACTION      Current Medications: Current Meds  Medication Sig   acetaminophen (TYLENOL) 325 MG tablet Take 325 mg by mouth as needed.   atorvastatin (LIPITOR) 20 MG tablet Take 1 tablet (20 mg total) by mouth daily.   omeprazole (PRILOSEC) 40 MG capsule Take 40 mg by mouth as needed.     Allergies:   Patient has no known allergies.   Social History   Socioeconomic History   Marital status: Single    Spouse name: Not on file   Number of children: Not on file   Years of education: Not on file   Highest education level: Not on file  Occupational History   Occupation: furniture delivery  Tobacco Use   Smoking status: Never   Smokeless tobacco: Never  Vaping Use   Vaping Use: Never used  Substance and Sexual Activity   Alcohol use: Yes    Comment: 3 times per week   Drug use: Yes    Types: Marijuana, Cocaine    Comment: marijuana not currentlt using   Sexual activity: Not on file  Other Topics Concern   Not on file  Social History Narrative   Not on file   Social Determinants of Health   Financial Resource Strain: Not on file  Food Insecurity: Not on file  Transportation Needs: Not on file  Physical Activity: Not on file  Stress: Not on file  Social Connections: Not on file     Family History: The patient's family history includes Cancer in his paternal grandfather. No HF hx  ROS:   Please see the history of present illness.     All other systems reviewed and are negative.  EKGs/Labs/Other Studies Reviewed:    The following studies were reviewed today:  Cardiac Event Monitoring: Date: 01/05/21 Results: Sinus rhythm PVCs remain resolved post ablation   Patient had a min HR of 39 bpm, max HR of 167 bpm, and avg HR of 79 bpm.  Bradycardia events appear to occur nocturnally  Transthoracic Echocardiogram: Date: 12/20/20 Results:  1. Left ventricular ejection fraction, by  estimation, is 55%. The left  ventricle has low normal function. The left ventricle has no regional wall  motion abnormalities. Left ventricular diastolic parameters were normal.  GLS -24.3%, normal.   2. Right ventricular systolic function is normal. The right ventricular  size is normal. There is normal pulmonary artery systolic pressure. The  estimated right ventricular systolic pressure is 123456 mmHg.   3. The mitral valve is normal in structure. Trivial mitral valve  regurgitation. No evidence of mitral stenosis.   4. The aortic valve is tricuspid. Aortic valve regurgitation is not  visualized. No aortic stenosis is present.   5. The inferior vena cava is normal in size with greater than 50%  respiratory variability, suggesting right atrial pressure of 3 mmHg.    ECG or NM Stress Testing : Date: 03/12/18 Results: Blood pressure demonstrated a normal response to exercise. There was no ST segment deviation noted during stress.   Normal ETT Normal hemodynamic response Good exercise tolerance 11.9 METS    Recent Labs: 01/25/2022: BUN 13; Creatinine, Ser 0.98; Potassium 4.7; Sodium 141 08/21/2022: ALT 76  Recent Lipid Panel    Component Value Date/Time   CHOL 202 (H) 08/21/2022 0801   TRIG 239 (H) 08/21/2022 0801   HDL 34 (L) 08/21/2022 0801   CHOLHDL 5.9 (H) 08/21/2022 0801   LDLCALC 125 (H) 08/21/2022 0801   LDLDIRECT 124 (H) 04/27/2022 [id number]        Physical Exam:    VS:  BP 115/74   Pulse 92   Ht 6\' 1"  (1.854 m)   Wt 263 lb (119.3 kg)   SpO2 97%   BMI 34.70 kg/m     Wt Readings from Last 3 Encounters:  09/13/22 263 lb (119.3 kg)  02/27/22 250 lb (113.4 kg)  01/25/22 252 lb 6.4 oz (114.5 kg)   Gen: No distress  Neck: No JVD Ears: No Pilar Plate Sign Cardiac: No Rubs or Gallops, no murmur, RRR, +2 radial pulses Respiratory: Clear to auscultation bilaterally, normal effort, normal  respiratory rate GI: Soft, nontender, non-distended  MS: No  edema;  moves all  extremities Integument: Skin feels  Neuro:  At time of evaluation, alert and oriented to person/place/time/situation  Psych: Normal affect, patient feels stressed  ASSESSMENT:    1. Essential hypertension   2. Dilated cardiomyopathy ([place])   3. PVC's (premature ventricular contractions)    PLAN:    Masked Hypertension PVC dilated cardiomyopathy s/p PVC ablation (last 2014 [facility] with resolution and LV recovery)  Recovered LV function Palpitations - Repeat 7 day non live ZioPatch - Repeat Echo - I have offered diltiazem as a PRN 30 mg PO PRN    HLD - LDL now at goal, continue status  Six  months APP One year me    Medication Adjustments/Labs and Tests Ordered: Current medicines are reviewed at length with the patient today.  Concerns regarding medicines are outlined above.  No orders of the defined types were placed in this encounter.  No orders of the defined types were placed in this encounter.   There are no Patient Instructions on file for this visit.   Signed, Christell Constant, MD  09/13/2022 5:34 PM    [facility]

## 2022-09-13 NOTE — Patient Instructions (Signed)
Medication Instructions:  Your physician recommends that you continue on your current medications as directed. Please refer to the Current Medication list given to you today.  *If you need a refill on your cardiac medications before your next appointment, please call your pharmacy*  Lab Work: NONE If you have labs (blood work) drawn today and your tests are completely normal, you will receive your results only by: MyChart Message (if you have MyChart) OR A paper copy in the mail If you have any lab test that is abnormal or we need to change your treatment, we will call you to review the results.  Testing/Procedures: ECHO Your physician has requested that you have an echocardiogram. Echocardiography is a painless test that uses sound waves to create images of your heart. It provides your doctor with information about the size and shape of your heart and how well your heart's chambers and valves are working. This procedure takes approximately one hour. There are no restrictions for this procedure. Please do NOT wear cologne, perfume, aftershave, or lotions (deodorant is allowed). Please arrive 15 minutes prior to your appointment time.  7-day Zio Your physician has recommended that you wear an event monitor. Event monitors are medical devices that record the heart's electrical activity. Doctors most often [place] these monitors to diagnose arrhythmias. Arrhythmias are problems with the speed or rhythm of the heartbeat. The monitor is a small, portable device. You can wear one while you do your normal daily activities. This is usually used to diagnose what is causing palpitations/syncope (passing out).  Follow-Up: At [facility], you and your health needs are our priority.  As part of our continuing mission to provide you with exceptional heart care, we have created designated Provider Care Teams.  These Care Teams include your primary Cardiologist (physician) and Advanced Practice Providers  (APPs -  Physician Assistants and Nurse Practitioners) who all work together to provide you with the care you need, when you need it.  We recommend signing up for the patient portal called "MyChart".  Sign up information is provided on this After Visit Summary.  MyChart is used to connect with patients for Virtual Visits (Telemedicine).  Patients are able to view lab/test results, encounter notes, upcoming appointments, etc.  Non-urgent messages can be sent to your provider as well.   To learn more about what you can do with MyChart, go to [email or web address].    Your next appointment:   6 month(s)  The format for your next appointment:   In Person  Provider:   Jari Favre, PA-C, Ronie Spies, PA-C, Robin Searing, NP, Jacolyn Reedy, PA-C, Eligha Bridegroom, NP, or Tereso Newcomer, PA-C     Then, [name], MD will plan to see you again in 1 year(s).    Other Instructions ZIO XT- Long Term Monitor Instructions  Your physician has requested you wear a ZIO patch monitor for 7 days.  This is a single patch monitor. [employer] supplies one patch monitor per enrollment. Additional stickers are not available. Please do not apply patch if you will be having a Nuclear Stress Test,  Echocardiogram, Cardiac CT, MRI, or Chest Xray during the period you would be wearing the  monitor. The patch cannot be worn during these tests. You cannot remove and re-apply the  ZIO XT patch monitor.  Your ZIO patch monitor will be mailed 3 day USPS to your address on file. It may take 3-5 days  to receive your monitor after you have been enrolled.  Once  you have received your monitor, please review the enclosed instructions. Your monitor  has already been registered assigning a specific monitor serial # to you.  Billing and Patient Assistance Program Information  We have supplied [employer] with any of your insurance information on file for billing purposes. [employer] offers a sliding scale Patient Assistance Program for  patients that do not have  insurance, or whose insurance does not completely cover the cost of the ZIO monitor.  You must apply for the Patient Assistance Program to qualify for this discounted rate.  To apply, please call [employer] at [phone], select option 4, select option 2, ask to apply for  Patient Assistance Program. [name] will ask your household income, and how many people  are in your household. They will quote your out-of-pocket cost based on that information.  [employer] will also be able to set up a 20-month, interest-free payment plan if needed.  Applying the monitor   Shave hair from upper left chest.  Hold abrader disc by orange tab. Rub abrader in 40 strokes over the upper left chest as  indicated in your monitor instructions.  Clean area with 4 enclosed alcohol pads. Let dry.  Apply patch as indicated in monitor instructions. Patch will be placed under collarbone on left  side of chest with arrow pointing upward.  Rub patch adhesive wings for 2 minutes. Remove white label marked "1". Remove the white  label marked "2". Rub patch adhesive wings for 2 additional minutes.  While looking in a mirror, press and release button in center of patch. A small green light will  flash 3-4 times. This will be your only indicator that the monitor has been turned on.  Do not shower for the first 24 hours. You may shower after the first 24 hours.  Press the button if you feel a symptom. You will hear a small click. Record Date, Time and  Symptom in the Patient Logbook.  When you are ready to remove the patch, follow instructions on the last 2 pages of Patient  Logbook. Stick patch monitor onto the last page of Patient Logbook.  Place Patient Logbook in the blue and white box. Use locking tab on box and tape box closed  securely. The blue and white box has prepaid postage on it. Please place it in the mailbox as  soon as possible. Your physician should have your test results approximately  7 days after the  monitor has been mailed back to [facility].  Call [facility] Customer Care at [phone] if you have questions regarding  your ZIO XT patch monitor. Call them immediately if you see an orange light blinking on your  monitor.  If your monitor falls off in less than 4 days, contact our Monitor department at [phone].  If your monitor becomes loose or falls off after 4 days call [employer] at [phone] for  suggestions on securing your monitor   Important Information About Sugar

## 2022-10-04 ENCOUNTER — Ambulatory Visit (HOSPITAL_COMMUNITY): Payer: 59 | Attending: Cardiovascular Disease

## 2022-10-04 DIAGNOSIS — I10 Essential (primary) hypertension: Secondary | ICD-10-CM | POA: Diagnosis not present

## 2022-10-04 DIAGNOSIS — I420 Dilated cardiomyopathy: Secondary | ICD-10-CM | POA: Diagnosis not present

## 2022-10-04 DIAGNOSIS — I493 Ventricular premature depolarization: Secondary | ICD-10-CM | POA: Diagnosis not present

## 2022-10-04 LAB — ECHOCARDIOGRAM COMPLETE
Area-P 1/2: 3.85 cm2
S' Lateral: 2.8 cm

## 2022-11-27 DIAGNOSIS — Z1152 Encounter for screening for COVID-19: Secondary | ICD-10-CM | POA: Diagnosis not present

## 2022-11-27 DIAGNOSIS — R519 Headache, unspecified: Secondary | ICD-10-CM | POA: Diagnosis not present

## 2022-11-27 DIAGNOSIS — J020 Streptococcal pharyngitis: Secondary | ICD-10-CM | POA: Diagnosis not present

## 2023-02-08 ENCOUNTER — Other Ambulatory Visit: Payer: Self-pay | Admitting: Student

## 2023-03-13 DIAGNOSIS — R1011 Right upper quadrant pain: Secondary | ICD-10-CM | POA: Diagnosis not present

## 2023-03-27 DIAGNOSIS — K760 Fatty (change of) liver, not elsewhere classified: Secondary | ICD-10-CM | POA: Diagnosis not present

## 2023-03-27 DIAGNOSIS — R1011 Right upper quadrant pain: Secondary | ICD-10-CM | POA: Diagnosis not present

## 2023-05-31 ENCOUNTER — Encounter: Payer: Self-pay | Admitting: Internal Medicine

## 2023-05-31 ENCOUNTER — Ambulatory Visit: Payer: 59 | Attending: Internal Medicine | Admitting: Internal Medicine

## 2023-05-31 VITALS — BP 132/90 | HR 75 | Ht 73.0 in | Wt 261.0 lb

## 2023-05-31 DIAGNOSIS — I10 Essential (primary) hypertension: Secondary | ICD-10-CM

## 2023-05-31 DIAGNOSIS — R002 Palpitations: Secondary | ICD-10-CM | POA: Insufficient documentation

## 2023-05-31 DIAGNOSIS — I420 Dilated cardiomyopathy: Secondary | ICD-10-CM | POA: Diagnosis not present

## 2023-05-31 DIAGNOSIS — I493 Ventricular premature depolarization: Secondary | ICD-10-CM

## 2023-05-31 NOTE — Patient Instructions (Signed)
Medication Instructions:  Your physician recommends that you continue on your current medications as directed. Please refer to the Current Medication list given to you today.  *If you need a refill on your cardiac medications before your next appointment, please call your pharmacy*   Lab Work: NONE  If you have labs (blood work) drawn today and your tests are completely normal, you will receive your results only by: MyChart Message (if you have MyChart) OR A paper copy in the mail If you have any lab test that is abnormal or we need to change your treatment, we will call you to review the results.   Testing/Procedures: Your physician has requested that you wear a heart monitor.  Follow-Up: At [facility], you and your health needs are our priority.  As part of our continuing mission to provide you with exceptional heart care, we have created designated Provider Care Teams.  These Care Teams include your primary Cardiologist (physician) and Advanced Practice Providers (APPs -  Physician Assistants and Nurse Practitioners) who all work together to provide you with the care you need, when you need it.  We recommend signing up for the patient portal called "MyChart".  Sign up information is provided on this After Visit Summary.  MyChart is used to connect with patients for Virtual Visits (Telemedicine).  Patients are able to view lab/test results, encounter notes, upcoming appointments, etc.  Non-urgent messages can be sent to your provider as well.   To learn more about what you can do with MyChart, go to [email or web address].    Your next appointment:   9 month(s)  Provider:   Roney Marion, M   Other Instructions Kevin Simpson- Long Term Monitor Instructions  Your physician has requested you wear a ZIO patch monitor for 14 days.  This is a single patch monitor. [employer] supplies one patch monitor per enrollment. Additional stickers are not available. Please do not apply  patch if you will be having a Nuclear Stress Test,   Cardiac CT, MRI, or Chest Xray during the period you would be wearing the  monitor. The patch cannot be worn during these tests. You cannot remove and re-apply the  ZIO XT patch monitor.  Your ZIO patch monitor will be mailed 3 day USPS to your address on file. It may take 3-5 days  to receive your monitor after you have been enrolled.  Once you have received your monitor, please review the enclosed instructions. Your monitor  has already been registered assigning a specific monitor serial # to you.  Billing and Patient Assistance Program Information  We have supplied [employer] with any of your insurance information on file for billing purposes. [employer] offers a sliding scale Patient Assistance Program for patients that do not have  insurance, or whose insurance does not completely cover the cost of the ZIO monitor.  You must apply for the Patient Assistance Program to qualify for this discounted rate.  To apply, please call [employer] at [phone], select option 4, select option 2, ask to apply for  Patient Assistance Program. [name] will ask your household income, and how many people  are in your household. They will quote your out-of-pocket cost based on that information.  [employer] will also be able to set up a 72-month, interest-free payment plan if needed.  Applying the monitor   Shave hair from upper left chest.  Hold abrader disc by orange tab. Rub abrader in 40 strokes over the upper left chest as  indicated in your  monitor instructions.  Clean area with 4 enclosed alcohol pads. Let dry.  Apply patch as indicated in monitor instructions. Patch will be placed under collarbone on left  side of chest with arrow pointing upward.  Rub patch adhesive wings for 2 minutes. Remove white label marked "1". Remove the white  label marked "2". Rub patch adhesive wings for 2 additional minutes.  While looking in a mirror, press and release  button in center of patch. A small green light will  flash 3-4 times. This will be your only indicator that the monitor has been turned on.  Do not shower for the first 24 hours. You may shower after the first 24 hours.  Press the button if you feel a symptom. You will hear a small click. Record Date, Time and  Symptom in the Patient Logbook.  When you are ready to remove the patch, follow instructions on the last 2 pages of Patient  Logbook. Stick patch monitor onto the last page of Patient Logbook.  Place Patient Logbook in the blue and white box. Use locking tab on box and tape box closed  securely. The blue and white box has prepaid postage on it. Please place it in the mailbox as  soon as possible. Your physician should have your test results approximately 7 days after the  monitor has been mailed back to [facility].  Call [facility] Customer Care at [phone] if you have questions regarding  your ZIO XT patch monitor. Call them immediately if you see an orange light blinking on your  monitor.  If your monitor falls off in less than 4 days, contact our Monitor department at [phone].  If your monitor becomes loose or falls off after 4 days call [employer] at [phone] for  suggestions on securing your monitor

## 2023-05-31 NOTE — Progress Notes (Signed)
Cardiology Office Note:    Date:  05/31/2023   ID:  Kevin Simpson, DOB [birth year], MRN [id number]  PCP:  Kevin Maker., MD   Kevin Simpson Cardiologist:  Kevin Range, MD (Inactive) Electrophysiologist:  Kevin Range, MD (Inactive)     Referring MD: Kevin Maker., MD   CC: Dilated Cardiomyopathy  History of Present Illness:    Kevin Simpson with a hx of Dilated cardiomyopathy with frequent PVCs; three prior ablations last at [facility] 2014) and CMR of LVEF 40% with no LGE in 2012 with no family history of cardiomyopathy who presents for evaluation 02/27/22. Hx of HTN and FHX.  Hx of notable alcohol use. Saw lipid clinic 5/23 with plans to get follow up labs in June. 2023: LDL back to normal 2024: Did not complete heart monitor.  LVEF had recovered  Patient notes that he is doing well.   Since last visit notes that he is having rare palpitations . There are no interval hospital/ED visit.   EKG showed SR Is having a a few drinks; in between sober and keg stand he is some what in between. He is a former Building services engineer at [employer].  Notes that he is not in his peak conditioning  No chest pain or pressure .  No SOB/DOE and no PND/Orthopnea.  No weight gain or leg swelling.    Ambulatory blood pressure no long performed .  Past Medical History:  Diagnosis Date   ACL (anterior cruciate ligament) rupture, left 10/30/2011   Acute lateral meniscus tear of left knee 10/30/2011   Acute medial meniscus tear of left knee 10/30/2011   DCM (dilated cardiomyopathy) (HCC)    EF 37% by Echo 07/19/11   Hypertension    PVC's (premature ventricular contractions)    s/p EPS and ablation 08/10/11 at [facility] and 03/08/12 at [facility], third ablation performed at [facility] by Dr Lavone Neri 11/14   Torn ACL     Past Surgical History:  Procedure Laterality Date   ANTERIOR CRUCIATE LIGAMENT REPAIR  10/30/2011   Procedure:  RECONSTRUCTION ANTERIOR CRUCIATE LIGAMENT (ACL);  Surgeon: Kevin Simpson;  Location: [facility] OR;  Service: Orthopedics;;   EP study and radiofrequency ablation  08/10/11, 03/08/12, 09/03/13   PVC ablation   MENISECTOMY  10/30/2011   Procedure: MENISECTOMY;  Surgeon: Kevin Simpson;  Location: [facility] OR;  Service: Orthopedics;  Laterality: Left;  medial and lateral   WISDOM TOOTH EXTRACTION      Current Medications: Current Meds  Medication Sig   acetaminophen (TYLENOL) 325 MG tablet Take 325 mg by mouth as needed.   atorvastatin (LIPITOR) 20 MG tablet TAKE ONE (1) TABLET BY MOUTH EACH DAY   omeprazole (PRILOSEC) 40 MG capsule Take 40 mg by mouth as needed.     Allergies:   Patient has no known allergies.   Social History   Socioeconomic History   Marital status: Single    Spouse name: Not on file   Number of children: Not on file   Years of education: Not on file   Highest education level: Not on file  Occupational History   Occupation: furniture delivery  Tobacco Use   Smoking status: Never   Smokeless tobacco: Never  Vaping Use   Vaping status: Never Used  Substance and Sexual Activity   Alcohol use: Yes    Comment: 3 times per week   Drug use: Yes    Types:  Marijuana, Cocaine    Comment: marijuana not currentlt using   Sexual activity: Not on file  Other Topics Concern   Not on file  Social History Narrative   Not on file   Social Determinants of Health   Financial Resource Strain: Not on file  Food Insecurity: Not on file  Transportation Needs: Not on file  Physical Activity: Not on file  Stress: Not on file  Social Connections: Not on file     Family History: The patient's family history includes Cancer in his paternal grandfather. No HF hx  ROS:   Please see the history of present illness.     EKGs/Labs/Other Studies Reviewed:    The following studies were reviewed today:  Cardiac Studies & Procedures     STRESS TESTS  EXERCISE TOLERANCE TEST (ETT)  03/12/2018  Narrative  Blood pressure demonstrated a normal response to exercise.  There was no ST segment deviation noted during stress.  Normal ETT Normal hemodynamic response Good exercise tolerance 11.9 METS   ECHOCARDIOGRAM  ECHOCARDIOGRAM COMPLETE 10/04/2022  Narrative ECHOCARDIOGRAM REPORT    Patient Name:   Kevin Simpson Date of Exam: 10/04/2022 Medical Rec #:  [id number]         Height:       73.0 in Accession #:    [id number]        Weight:       263.0 lb Date of Birth:  May 15, 1986        BSA:          2.417 m Patient Age:    35 years          BP:           115/74 mmHg Patient Gender: M                 HR:           74 bpm. Exam Location:  [address]  Procedure: 2D Echo, 3D Echo, Cardiac Doppler, Color Doppler and Strain Analysis  Indications:    I42.0 Dilated Cardiomyopathy  History:        Patient has prior history of Echocardiogram examinations, most recent 12/20/2020. Abnormal ECG, Arrythmias:PVC, Signs/Symptoms:Shortness of Breath and Dizziness/Lightheadedness; Risk Factors:Hypertension. Palpitaitons, History of 3 Ablations (2012, 2013, 2014), History of Cocaine Abuse.  Sonographer:    Kevin Simpson Referring Phys: Kevin Simpson  IMPRESSIONS   1. Left ventricular ejection fraction, by estimation, is 60 to 65%. Left ventricular ejection fraction by 3D volume is 67 %. The left ventricle has normal function. The left ventricle has no regional wall motion abnormalities. Left ventricular diastolic parameters were normal. The average left ventricular global longitudinal strain is -20.4 %. The global longitudinal strain is normal. 2. Right ventricular systolic function is normal. The right ventricular size is normal. 3. The mitral valve is normal in structure. Trivial mitral valve regurgitation. No evidence of mitral stenosis. 4. The aortic valve is normal in structure. Aortic valve regurgitation is not visualized. No aortic stenosis is  present. 5. The inferior vena cava is normal in size with greater than 50% respiratory variability, suggesting right atrial pressure of 3 mmHg.  FINDINGS Left Ventricle: Left ventricular ejection fraction, by estimation, is 60 to 65%. Left ventricular ejection fraction by 3D volume is 67 %. The left ventricle has normal function. The left ventricle has no regional wall motion abnormalities. The average left ventricular global longitudinal strain is -20.4 %. The global longitudinal strain is normal. The left ventricular  internal cavity size was normal in size. There is no left ventricular hypertrophy. Left ventricular diastolic parameters were normal. Normal left ventricular filling pressure.  Right Ventricle: The right ventricular size is normal. No increase in right ventricular wall thickness. Right ventricular systolic function is normal.  Left Atrium: Left atrial size was normal in size.  Right Atrium: Right atrial size was normal in size.  Pericardium: There is no evidence of pericardial effusion.  Mitral Valve: The mitral valve is normal in structure. Trivial mitral valve regurgitation. No evidence of mitral valve stenosis.  Tricuspid Valve: The tricuspid valve is normal in structure. Tricuspid valve regurgitation is not demonstrated. No evidence of tricuspid stenosis.  Aortic Valve: The aortic valve is normal in structure. Aortic valve regurgitation is not visualized. No aortic stenosis is present.  Pulmonic Valve: The pulmonic valve was normal in structure. Pulmonic valve regurgitation is not visualized. No evidence of pulmonic stenosis.  Aorta: The aortic root is normal in size and structure.  Venous: The inferior vena cava is normal in size with greater than 50% respiratory variability, suggesting right atrial pressure of 3 mmHg.  IAS/Shunts: No atrial level shunt detected by color flow Doppler.   LEFT VENTRICLE PLAX 2D LVIDd:         4.80 cm         Diastology LVIDs:          2.80 cm         LV e' medial:    9.25 cm/s LV PW:         0.70 cm         LV E/e' medial:  10.8 LV IVS:        1.00 cm         LV e' lateral:   18.60 cm/s LVOT diam:     2.50 cm         LV E/e' lateral: 5.4 LV SV:         103 LV SV Index:   43              2D LVOT Area:     4.91 cm        Longitudinal Strain 2D Strain GLS  -20.4 % (A2C): 2D Strain GLS  -19.6 % (A3C): 2D Strain GLS  -21.3 % (A4C): 2D Strain GLS  -20.4 % Avg:  3D Volume EF LV 3D EF:    Left ventricul ar ejection fraction by 3D volume is 67 %.  3D Volume EF: 3D EF:        67 % LV EDV:       156 ml LV ESV:       52 ml LV SV:        104 ml  RIGHT VENTRICLE RV Basal diam:  3.90 cm RV S prime:     14.00 cm/s TAPSE (M-mode): 2.0 cm  LEFT ATRIUM             Index        RIGHT ATRIUM           Index LA diam:        3.60 cm 1.49 cm/m   RA Area:     20.40 cm LA Vol (A2C):   64.7 ml 26.77 ml/m  RA Volume:   64.00 ml  26.48 ml/m LA Vol (A4C):   65.7 ml 27.18 ml/m LA Biplane Vol: 70.8 ml 29.29 ml/m AORTIC VALVE LVOT Vmax:   110.50 cm/s LVOT Vmean:  71.700 cm/s  LVOT VTI:    0.210 m  AORTA Ao Root diam: 3.00 cm Ao Asc diam:  3.00 cm  MITRAL VALVE MV Area (PHT): cm          SHUNTS MV Decel Time: 197 msec     Systemic VTI:  0.21 m MV E velocity: 100.25 cm/s  Systemic Diam: 2.50 cm MV A velocity: 59.10 cm/s MV E/A ratio:  1.70  Mihai Croitoru MD Electronically signed by Thurmon Fair MD Signature Date/Time: 10/04/2022/12:12:10 PM    Final    MONITORS  LONG TERM MONITOR (3-14 DAYS) 01/05/2021  Narrative Sinus rhythm PVCs remain resolved Simpson ablation  Patient had a min HR of 39 bpm, max HR of 167 bpm, and avg HR of 79 bpm.  Bradycardia events appear to occur nocturnally   CT SCANS  CT CORONARY MORPH W/CTA COR W/SCORE 11/28/2019  Addendum 11/28/2019  5:05 PM ADDENDUM REPORT: 11/28/2019 17:02  HISTORY: Chest pain, cardiac cause suspected, CHEST PAIN OR  PRESSURE  EXAM: Cardiac/Coronary CT  TECHNIQUE: The patient was scanned on a [employer].  PROTOCOL: A 120 kV prospective scan was triggered in the descending thoracic aorta at 111 HU's. Axial non-contrast 3 mm slices were carried out through the heart. The data set was analyzed on a dedicated work station and scored using the Agatson method. Gantry rotation speed was 250 msecs and collimation was 0.6 mm. Beta blockade and 0.8 mg of sl NTG was given. The 3D data set was reconstructed in 5% intervals of 35-75% of the R-R cycle. Diastolic phases were analyzed on a dedicated work station using MPR, MIP and VRT modes. The patient received OMNIPAQUE IOHEXOL 350 MG/ML SOLN of contrast.  FINDINGS: Coronary calcium score: The patient's coronary artery calcium score is 0, which places the patient in the 0 percentile.  Coronary arteries: Normal coronary origins.  Left dominance.  Right Coronary Artery: Small caliber vessel. No significant plaque or stenosis. Short, nondominant vessel.  Left Main Coronary Artery: Normal caliber vessel. No significant plaque or stenosis.  Left Anterior Descending Coronary Artery: Normal caliber vessel. No significant plaque or stenosis. Gives rise to 1 large diagonal branch with two sub-branches. Distal LAD wraps apex.  Left Circumflex Artery: Normal caliber vessel, gives rise to PDA. No significant plaque or stenosis. Gives rise to 1 large OM branch.  Aorta: Normal size, 31 mm at the mid ascending aorta (level of the PA bifurcation) measured double oblique. No calcifications. No dissection.  Aortic Valve: No calcifications. Trileaflet.  Other findings:  Normal pulmonary vein drainage into the left atrium.  Normal left atrial appendage without a thrombus.  Normal size of the pulmonary artery.  IMPRESSION: 1. No evidence of CAD, CADRADS = 0.  2. Coronary calcium score of 0. This was 0 percentile for age and sex matched  control.  3. Normal coronary origin with left dominance.   Electronically Signed By: Jodelle Red M.D. On: 11/28/2019 17:02  Narrative EXAM: OVER-READ INTERPRETATION  CT CHEST  The following report is an over-read performed by radiologist Dr. Irish Lack of [facility] on 11/28/2019. This over-read does not include interpretation of cardiac or coronary anatomy or pathology. The coronary CTA interpretation by the cardiologist is attached.  COMPARISON:  None.  FINDINGS: Vascular: No incidental vascular findings.  Mediastinum/Nodes: Visualized mediastinum and hilar regions demonstrate no lymphadenopathy or focal masses.  Lungs/Pleura: Visualized lungs show no evidence of pulmonary edema, consolidation, pneumothorax, nodule or pleural fluid.  Upper Abdomen: The visualized upper abdomen  demonstrates diffuse hepatic steatosis.  Musculoskeletal: No bony abnormalities identified.  IMPRESSION: No significant noncardiac findings in the visualized chest. Diffuse hepatic steatosis.  Electronically Signed: By: Irish Lack M.D. On: 11/28/2019 11:05   CARDIAC MRI  MR CARDIAC MORPHOLOGY W WO CONTRAST 08/04/2011  Narrative *RADIOLOGY REPORT*  Clinical Data: PVCs, dilated cardiomyopathy.  MR CARDIA MORPHOLOGY WITHOUT AND WITH CONTRAST  GE 1.5 T magnet with dedicated cardiac coil.  FIESTA sequences for function and morphology.  T1 and T1 fat sat sequences for tissue characterization.  10 minutes after 25 mL Multihance contrast was given, inversion recovery sequences were done to assess for delayed enhancement.  Unable to calculate EF at workstation due to frequent PVCs.  Contrast: 25mL MULTIHANCE GADOBENATE DIMEGLUMINE 529 MG/ML IV SOLN  Comparison: None.  Findings: Difficult study due to gating artifact from frequent PVCs.  Normal left ventricular size and wall thickness.  Unable to calculated EF volumetrically due to frequent PVCs.   Visually, EF appeared mild to moderate depressed, would estimate 40%.  Global hypokinesis.  The right ventricle was normal in size with mildly reduced global systolic function.  No regional wall motion abnormalities in the RV or LV.  No aneurysmal segments in the RV wall.  The atria appeared normal in size.  Difficult to comment on valvular regurgitation due to artifact.  On delayed enhancement images, there was no myocardial delayed enhancement.  IMPRESSION: 1.  Difficult study due to frequent PVCs.  2.  Normal LV size with mild to moderate global systolic dysfunction, no regionality.  EF estimated at 40%.  Unable to calculate EF due frequent PVCs.  3. Normal RV size with mild systolic dysfunction.  No regional RV wall motion abnormalities or aneurysmal segments.  4.  No myocardial delayed enhancement, so no evidence for prior MI, infiltrative disease, or myocarditis.  5.  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  This study is not suggestive of ARVD.  Original Report Authenticated By: [id number]           Recent Labs: 08/21/2022: ALT 76  Recent Lipid Panel    Component Value Date/Time   CHOL 202 (H) 08/21/2022 0801   TRIG 239 (H) 08/21/2022 0801   HDL 34 (L) 08/21/2022 0801   CHOLHDL 5.9 (H) 08/21/2022 0801   LDLCALC 125 (H) 08/21/2022 0801   LDLDIRECT 124 (H) 04/27/2022 6962        Physical Exam:    VS:  BP (!) 132/90   Pulse 75   Ht 6\' 1"  (1.854 m)   Wt 261 lb (118.4 kg)   SpO2 97%   BMI 34.43 kg/m     Wt Readings from Last 3 Encounters:  05/31/23 261 lb (118.4 kg)  09/13/22 263 lb (119.3 kg)  02/27/22 250 lb (113.4 kg)   Gen: No distress  Neck: No JVD Ears: No Frank Sign Cardiac: No Rubs or Gallops, no murmur, RRR, +2 radial pulses Respiratory: Clear to auscultation bilaterally, normal effort, normal  respiratory rate GI: Soft, nontender, non-distended  MS: trace edema;  moves all  extremities Integument: Skin feels  Neuro:  At time of evaluation, alert and oriented to person/place/time/situation  Psych: Normal affect, patient feels stressed  ASSESSMENT:    1. Palpitations   2. Dilated cardiomyopathy (HCC)   3. PVC's (premature ventricular contractions)   4. Essential hypertension    PLAN:    Palpitations - complicated by masked hypertension and alcohol use - with history  of PVC dilated cardiomyopathy s/p PVC ablation (last 2014 [facility] with resolution and LV recovery)  Will get ziopatch - will do ambulatory BP monitoring with his [employer] 4G Cellular BP 800 (validated cuff) - if PVCs, will start AV nodal agent - if HTN will offer medication (low dose diuretic) - we have discussed that therapy for both would be decreased alcohol intake  He prefers that I use superlatives to address his care (EKG is great, rather than EKG is ok)     HLD - LDL now at goal, continue status; if alcohol stop we may be able to wean medication  Nine months APP or me   Medication Adjustments/Labs and Tests Ordered: Current medicines are reviewed at length with the patient today.  Concerns regarding medicines are outlined above.  Orders Placed This Encounter  Procedures   EKG 12-Lead   No orders of the defined types were placed in this encounter.   Patient Instructions  Medication Instructions:  Your physician recommends that you continue on your current medications as directed. Please refer to the Current Medication list given to you today.  *If you need a refill on your cardiac medications before your next appointment, please call your pharmacy*   Lab Work: NONE  If you have labs (blood work) drawn today and your tests are completely normal, you will receive your results only by: MyChart Message (if you have MyChart) OR A paper copy in the mail If you have any lab test that is abnormal or we need to change your treatment, we will call you to review the  results.   Testing/Procedures: Your physician has requested that you wear a heart monitor.  Follow-Up: At [facility], you and your health needs are our priority.  As part of our continuing mission to provide you with exceptional heart care, we have created designated Provider Care Teams.  These Care Teams include your primary Cardiologist (physician) and [facility] (APPs -  Physician Assistants and Nurse Practitioners) who all work together to provide you with the care you need, when you need it.  We recommend signing up for the patient portal called "MyChart".  Sign up information is provided on this After Visit Summary.  MyChart is used to connect with patients for Virtual Visits (Telemedicine).  Patients are able to view lab/test results, encounter notes, upcoming appointments, etc.  Non-urgent messages can be sent to your provider as well.   To learn more about what you can do with MyChart, go to [email or web address].    Your next appointment:   9 month(s)  Provider:   Roney Marion, M   Other Instructions Christena Deem- Long Term Monitor Instructions  Your physician has requested you wear a ZIO patch monitor for 14 days.  This is a single patch monitor. [employer] supplies one patch monitor per enrollment. Additional stickers are not available. Please do not apply patch if you will be having a Nuclear Stress Test,   Cardiac CT, MRI, or Chest Xray during the period you would be wearing the  monitor. The patch cannot be worn during these tests. You cannot remove and re-apply the  ZIO XT patch monitor.  Your ZIO patch monitor will be mailed 3 day USPS to your address on file. It may take 3-5 days  to receive your monitor after you have been enrolled.  Once you have received your monitor, please review the enclosed instructions. Your monitor  has already been registered assigning a specific monitor serial # to you.  Billing and Patient Assistance Program  Information  We have supplied [employer] with any of your insurance information on file for billing purposes. [employer] offers a sliding scale Patient Assistance Program for patients that do not have  insurance, or whose insurance does not completely cover the cost of the ZIO monitor.  You must apply for the Patient Assistance Program to qualify for this discounted rate.  To apply, please call [employer] at [phone], select option 4, select option 2, ask to apply for  Patient Assistance Program. [name] will ask your household income, and how many people  are in your household. They will quote your out-of-pocket cost based on that information.  [employer] will also be able to set up a 24-month, interest-free payment plan if needed.  Applying the monitor   Shave hair from upper left chest.  Hold abrader disc by orange tab. Rub abrader in 40 strokes over the upper left chest as  indicated in your monitor instructions.  Clean area with 4 enclosed alcohol pads. Let dry.  Apply patch as indicated in monitor instructions. Patch will be placed under collarbone on left  side of chest with arrow pointing upward.  Rub patch adhesive wings for 2 minutes. Remove white label marked "1". Remove the white  label marked "2". Rub patch adhesive wings for 2 additional minutes.  While looking in a mirror, press and release button in center of patch. A small green light will  flash 3-4 times. This will be your only indicator that the monitor has been turned on.  Do not shower for the first 24 hours. You may shower after the first 24 hours.  Press the button if you feel a symptom. You will hear a small click. Record Date, Time and  Symptom in the Patient Logbook.  When you are ready to remove the patch, follow instructions on the last 2 pages of Patient  Logbook. Stick patch monitor onto the last page of Patient Logbook.  Place Patient Logbook in the blue and white box. Use locking tab on box and tape box closed   securely. The blue and white box has prepaid postage on it. Please place it in the mailbox as  soon as possible. Your physician should have your test results approximately 7 days after the  monitor has been mailed back to [facility].  Call [facility] Customer Care at [phone] if you have questions regarding  your ZIO XT patch monitor. Call them immediately if you see an orange light blinking on your  monitor.  If your monitor falls off in less than 4 days, contact our Monitor department at [phone].  If your monitor becomes loose or falls off after 4 days call [employer] at [phone] for  suggestions on securing your monitor     Signed, Christell Constant, MD  05/31/2023 11:32 AM    [facility]

## 2023-09-24 ENCOUNTER — Other Ambulatory Visit: Payer: Self-pay | Admitting: Internal Medicine

## 2023-10-03 DIAGNOSIS — J0100 Acute maxillary sinusitis, unspecified: Secondary | ICD-10-CM | POA: Diagnosis not present

## 2023-10-03 DIAGNOSIS — R519 Headache, unspecified: Secondary | ICD-10-CM | POA: Diagnosis not present

## 2023-10-03 DIAGNOSIS — R0981 Nasal congestion: Secondary | ICD-10-CM | POA: Diagnosis not present

## 2023-12-31 DIAGNOSIS — J22 Unspecified acute lower respiratory infection: Secondary | ICD-10-CM | POA: Diagnosis not present

## 2024-01-02 ENCOUNTER — Telehealth: Payer: Self-pay | Admitting: Internal Medicine

## 2024-01-02 NOTE — Telephone Encounter (Signed)
 Called pt reports has had cold symptoms for a while went to [facility] was prescribed a Zpack.  Reports no testing was done just blindly given med.  Reports took 1 dose of Zpack and began to feel bad.   Pt reports not true CP just feels achy and cramping that is constant.  Denies arm, jaw and shoulder pain.  Has not checked BP has a cuff but doesn't know if it is accurate.  Reports still has cold symptoms.  Advised pt to f/u with office that prescribed Zpack or PCP to notify of s/e and to notify that still has cold symptoms.   Pt reports has not had a PCP since high school and was hoping our office could make recommendations on what he could take.  Advised our office can not advise.  Advised ED visit for CP/ chest discomfort.  Pt expresses doesn't want to go to ED.  Offered OV with MD on 01/11/24 pt declined would like to be seen sooner.  Advised pt to f/u cold symptoms if has persist CP/ discomfort to report to ED.

## 2024-01-02 NOTE — Telephone Encounter (Signed)
   Pt c/o of Chest Pain: STAT if active CP, including tightness, pressure, jaw pain, radiating pain to shoulder/upper arm/back, CP unrelieved by Nitro. Symptoms reported of SOB, nausea, vomiting, sweating.  1. Are you having CP right now? Patient says his heart feels like it is cramping, weak, does not feel normal    2. Are you experiencing any other symptoms (ex. SOB, nausea, vomiting, sweating)?  Headache- but this could be coming from his cold- Monday he took the Z Pack- he said he had palpitations and heart was racing   3. Is your CP continuous or coming and going? He says it is pretty constant   4. Have you taken Nitroglycerin? no   5. How long have you been experiencing CP? Yesterday and still not feeling that great this morning - patient says he might need to be seen   6. If NO CP at time of call then end call with telling Pt to call back or call 911 if Chest pain returns prior to return call from triage team.

## 2024-01-07 NOTE — Telephone Encounter (Signed)
 Left a message to call back.

## 2024-01-14 NOTE — Telephone Encounter (Signed)
 Left a message to call back.

## 2024-01-17 NOTE — Telephone Encounter (Signed)
 Will close encounter have tried reaching pt Kevin Simpson.

## 2024-02-15 DIAGNOSIS — F4325 Adjustment disorder with mixed disturbance of emotions and conduct: Secondary | ICD-10-CM | POA: Diagnosis not present

## 2024-02-25 DIAGNOSIS — N50812 Left testicular pain: Secondary | ICD-10-CM | POA: Diagnosis not present

## 2024-02-25 DIAGNOSIS — Z7689 Persons encountering health services in other specified circumstances: Secondary | ICD-10-CM | POA: Diagnosis not present

## 2024-02-26 DIAGNOSIS — N50812 Left testicular pain: Secondary | ICD-10-CM | POA: Diagnosis not present

## 2024-03-03 DIAGNOSIS — N50812 Left testicular pain: Secondary | ICD-10-CM | POA: Diagnosis not present

## 2024-03-05 DIAGNOSIS — F4325 Adjustment disorder with mixed disturbance of emotions and conduct: Secondary | ICD-10-CM | POA: Diagnosis not present

## 2024-03-19 DIAGNOSIS — Z13220 Encounter for screening for lipoid disorders: Secondary | ICD-10-CM | POA: Diagnosis not present

## 2024-03-19 DIAGNOSIS — Z2820 Immunization not carried out because of patient decision for unspecified reason: Secondary | ICD-10-CM | POA: Diagnosis not present

## 2024-03-19 DIAGNOSIS — F419 Anxiety disorder, unspecified: Secondary | ICD-10-CM | POA: Diagnosis not present

## 2024-03-19 DIAGNOSIS — E66811 Obesity, class 1: Secondary | ICD-10-CM | POA: Diagnosis not present

## 2024-03-19 DIAGNOSIS — Z0000 Encounter for general adult medical examination without abnormal findings: Secondary | ICD-10-CM | POA: Diagnosis not present

## 2024-03-19 DIAGNOSIS — R0683 Snoring: Secondary | ICD-10-CM | POA: Diagnosis not present

## 2024-03-21 DIAGNOSIS — F4325 Adjustment disorder with mixed disturbance of emotions and conduct: Secondary | ICD-10-CM | POA: Diagnosis not present

## 2024-04-16 ENCOUNTER — Other Ambulatory Visit: Payer: Self-pay | Admitting: Internal Medicine

## 2024-04-21 DIAGNOSIS — G4730 Sleep apnea, unspecified: Secondary | ICD-10-CM | POA: Diagnosis not present

## 2024-06-05 DIAGNOSIS — G4733 Obstructive sleep apnea (adult) (pediatric): Secondary | ICD-10-CM | POA: Diagnosis not present

## 2024-06-08 DIAGNOSIS — G4733 Obstructive sleep apnea (adult) (pediatric): Secondary | ICD-10-CM | POA: Diagnosis not present

## 2024-06-23 DIAGNOSIS — J019 Acute sinusitis, unspecified: Secondary | ICD-10-CM | POA: Diagnosis not present

## 2024-06-23 DIAGNOSIS — B9689 Other specified bacterial agents as the cause of diseases classified elsewhere: Secondary | ICD-10-CM | POA: Diagnosis not present

## 2024-07-25 DIAGNOSIS — F4325 Adjustment disorder with mixed disturbance of emotions and conduct: Secondary | ICD-10-CM | POA: Diagnosis not present

## 2024-07-29 ENCOUNTER — Other Ambulatory Visit: Payer: Self-pay | Admitting: Internal Medicine

## 2024-08-12 DIAGNOSIS — J0100 Acute maxillary sinusitis, unspecified: Secondary | ICD-10-CM | POA: Diagnosis not present

## 2024-08-15 ENCOUNTER — Telehealth: Payer: Self-pay

## 2024-08-15 ENCOUNTER — Other Ambulatory Visit: Payer: Self-pay

## 2024-08-15 ENCOUNTER — Encounter: Payer: Self-pay | Admitting: Internal Medicine

## 2024-08-15 ENCOUNTER — Ambulatory Visit: Attending: Internal Medicine | Admitting: Internal Medicine

## 2024-08-15 ENCOUNTER — Other Ambulatory Visit (HOSPITAL_COMMUNITY): Payer: Self-pay

## 2024-08-15 VITALS — BP 130/80 | HR 73 | Ht 73.0 in | Wt 274.0 lb

## 2024-08-15 DIAGNOSIS — R002 Palpitations: Secondary | ICD-10-CM

## 2024-08-15 DIAGNOSIS — I493 Ventricular premature depolarization: Secondary | ICD-10-CM | POA: Diagnosis not present

## 2024-08-15 DIAGNOSIS — G4733 Obstructive sleep apnea (adult) (pediatric): Secondary | ICD-10-CM

## 2024-08-15 DIAGNOSIS — E785 Hyperlipidemia, unspecified: Secondary | ICD-10-CM | POA: Diagnosis not present

## 2024-08-15 DIAGNOSIS — I10 Essential (primary) hypertension: Secondary | ICD-10-CM

## 2024-08-15 DIAGNOSIS — I420 Dilated cardiomyopathy: Secondary | ICD-10-CM

## 2024-08-15 LAB — LDL CHOLESTEROL, DIRECT

## 2024-08-15 NOTE — Telephone Encounter (Signed)
 Pharmacy Patient Advocate Encounter   Received notification from [employer] that prior authorization for ZEPBOUND is required/requested.   Insurance verification completed.   The patient is insured through [facility].   Per test claim: PA required; PA submitted to above mentioned insurance via Lat[employer] Key/confirmation #/[id number] Status is pending

## 2024-08-15 NOTE — Patient Instructions (Addendum)
 Medication Instructions:  Your physician recommends that you continue on your current medications as directed. Please refer to the Current Medication list given to you today.  *If you need a refill on your cardiac medications before your next appointment, please call your pharmacy*  Lab Work: TODAY: LDL, ALT  Follow-Up: At [facility], you and your health needs are our priority.  As part of our continuing mission to provide you with exceptional heart care, our providers are all part of one team.  This team includes your primary Cardiologist (physician) and Advanced Practice Providers or APPs (Physician Assistants and Nurse Practitioners) who all work together to provide you with the care you need, when you need it.  Your next appointment:   1 year  Provider:   Stanly DELENA Leavens, MD   Follow up with PharmD in [facility] in February 2026

## 2024-08-15 NOTE — Progress Notes (Signed)
 Cardiology Office Note:    Date:  08/15/2024   ID:  Kevin Simpson, DOB 1986-02-26, MRN [id number]  PCP:  Geroge Charlie CROME., MD   [facility] Providers Cardiologist:  Stanly DELENA Leavens, MD Electrophysiologist:  Lynwood Rakers, MD (Inactive)     Referring MD: Geroge Charlie CROME., MD   CC: Dilated Cardiomyopathy  History of Present Illness:    Kevin Simpson is a 38 y.o. male with a hx of Dilated cardiomyopathy with frequent PVCs; three prior ablations last at [facility] 2014) and CMR of LVEF 40% with no LGE in 2012 with no family history of cardiomyopathy who presents for evaluation 02/27/22. Hx of HTN and FHX.  Hx of notable alcohol use. Saw lipid clinic 5/23 with plans to get follow up labs in June. 2023: LDL back to normal 2024: Did not complete heart monitor.  LVEF had recovered Practitioner note: He prefers we use superlatives to address his care (EKG is great, rather than EKG is ok)- first discussed in 2024. Social: In 2024 notes: some alcohol use.  Former Building Services Engineer at [employer]. Third baby is due in May.  Kevin Simpson is a 38 year old male with cardiomyopathy and frequent PVCs who presents for evaluation of heart health and management of sleep apnea.  He has a history of cardiomyopathy and frequent PVCs, having undergone three ablations, the last in 2014. His ejection fraction was 40% in 2023 with no late gadolinium enhancement, and his echocardiogram showed recovery of function. He denies experiencing heart failure symptoms or recent PVC episodes.  He has severe obstructive sleep apnea with an AHI of 59. Despite undergoing a sleep study and being fitted for a CPAP mask, he struggles with effective use, managing only a few hours of wear. He experiences frequent sinus infections, treated with antibiotics twice this year, and has a deviated septum with significant nasal congestion. He uses a saline rinse and nasal medication to alleviate symptoms and improve CPAP  tolerance.  He has hypertension and is on atorvastatin  20 mg for hyperlipidemia. Recent lipid levels are unavailable. He has reduced alcohol intake and is attempting weight loss, but reports fluctuating weight and challenges maintaining a consistent exercise routine.  He experiences night terrors, waking with a racing heart, which he suspects may be related to his sleep apnea. He has a history of morbid obesity. He has attempted weight loss through diet and exercise, achieving some success earlier in the year, but has since regained some weight. He is motivated to continue working on [employer] to improve his overall health.   Past Medical History:  Diagnosis Date   ACL (anterior cruciate ligament) rupture, left 10/30/2011   Acute lateral meniscus tear of left knee 10/30/2011   Acute medial meniscus tear of left knee 10/30/2011   DCM (dilated cardiomyopathy) (HCC)    EF 37% by Echo 07/19/11   Hypertension    PVC's (premature ventricular contractions)    s/p EPS and ablation 08/10/11 at [facility] and 03/08/12 at [facility], third ablation performed at [facility] by Dr Corean 11/14   Torn ACL     Past Surgical History:  Procedure Laterality Date   ANTERIOR CRUCIATE LIGAMENT REPAIR  10/30/2011   Procedure: RECONSTRUCTION ANTERIOR CRUCIATE LIGAMENT (ACL);  Surgeon: Fonda SHAUNNA Olmsted;  Location: [facility] OR;  Service: Orthopedics;;   EP study and radiofrequency ablation  08/10/11, 03/08/12, 09/03/13   PVC ablation   MENISECTOMY  10/30/2011   Procedure: MENISECTOMY;  Surgeon:  Fonda SHAUNNA Olmsted;  Location: [facility] OR;  Service: Orthopedics;  Laterality: Left;  medial and lateral   WISDOM TOOTH EXTRACTION      Current Medications: Current Meds  Medication Sig   atorvastatin  (LIPITOR) 20 MG tablet TAKE ONE (1) TABLET BY MOUTH EACH DAY   fluticasone (FLONASE) 50 MCG/ACT nasal spray Place into both nostrils daily.   ibuprofen (ADVIL) 200 MG tablet Take 200 mg by mouth as  needed for headache or moderate pain (pain score 4-6).     Allergies:   Patient has no known allergies.   Social History   Socioeconomic History   Marital status: Single    Spouse name: Not on file   Number of children: Not on file   Years of education: Not on file   Highest education level: Not on file  Occupational History   Occupation: furniture delivery  Tobacco Use   Smoking status: Never   Smokeless tobacco: Never  Vaping Use   Vaping status: Never Used  Substance and Sexual Activity   Alcohol use: Yes    Comment: 3 times per week   Drug use: Yes    Types: Marijuana, Cocaine    Comment: marijuana not currentlt using   Sexual activity: Not on file  Other Topics Concern   Not on file  Social History Narrative   Not on file   Social Drivers of Health   Financial Resource Strain: Not on file  Food Insecurity: Low Risk  (02/25/2024)   Received from [facility]   Hunger Vital Sign    Within the past 12 months, you worried that your food would run out before you got money to buy more: Never true    Within the past 12 months, the food you bought just didn't last and you didn't have money to get more. : Never true  Transportation Needs: No Transportation Needs (03/19/2024)   Received from [employer]    In the past 12 months, has lack of reliable transportation kept you from medical appointments, meetings, work or from getting things needed for daily living? : No  Physical Activity: Not on file  Stress: Not on file  Social Connections: Not on file     Family History: The patient's family history includes Cancer in his paternal grandfather. No HF hx  ROS:   Please see the history of present illness.     EKGs/Labs/Other Studies Reviewed:    The following studies were reviewed today:  Cardiac Studies & Procedures   ______________________________________________________________________________________________   STRESS TESTS  EXERCISE  TOLERANCE TEST (ETT) 03/12/2018  Interpretation Summary  Blood pressure demonstrated a normal response to exercise.  There was no ST segment deviation noted during stress.  Normal ETT Normal hemodynamic response Good exercise tolerance 11.9 METS   ECHOCARDIOGRAM  ECHOCARDIOGRAM COMPLETE 10/04/2022  Narrative ECHOCARDIOGRAM REPORT    Patient Name:   Kevin Simpson Date of Exam: 10/04/2022 Medical Rec #:  [id number]         Height:       73.0 in Accession #:    [id number]        Weight:       263.0 lb Date of Birth:  03/28/1986        BSA:          2.417 m Patient Age:    35 years          BP:           115/74 mmHg  Patient Gender: M                 HR:           74 bpm. Exam Location:  [address]  Procedure: 2D Echo, 3D Echo, Cardiac Doppler, Color Doppler and Strain Analysis  Indications:    I42.0 Dilated Cardiomyopathy  History:        Patient has prior history of Echocardiogram examinations, most recent 12/20/2020. Abnormal ECG, Arrythmias:PVC, Signs/Symptoms:Shortness of Breath and Dizziness/Lightheadedness; Risk Factors:Hypertension. Palpitaitons, History of 3 Ablations (2012, 2013, 2014), History of Cocaine Abuse.  Sonographer:    Heather Hawks RDCS Referring Phys: CHARLIE L. ORR  IMPRESSIONS   1. Left ventricular ejection fraction, by estimation, is 60 to 65%. Left ventricular ejection fraction by 3D volume is 67 %. The left ventricle has normal function. The left ventricle has no regional wall motion abnormalities. Left ventricular diastolic parameters were normal. The average left ventricular global longitudinal strain is -20.4 %. The global longitudinal strain is normal. 2. Right ventricular systolic function is normal. The right ventricular size is normal. 3. The mitral valve is normal in structure. Trivial mitral valve regurgitation. No evidence of mitral stenosis. 4. The aortic valve is normal in structure. Aortic valve regurgitation is not  visualized. No aortic stenosis is present. 5. The inferior vena cava is normal in size with greater than 50% respiratory variability, suggesting right atrial pressure of 3 mmHg.  FINDINGS Left Ventricle: Left ventricular ejection fraction, by estimation, is 60 to 65%. Left ventricular ejection fraction by 3D volume is 67 %. The left ventricle has normal function. The left ventricle has no regional wall motion abnormalities. The average left ventricular global longitudinal strain is -20.4 %. The global longitudinal strain is normal. The left ventricular internal cavity size was normal in size. There is no left ventricular hypertrophy. Left ventricular diastolic parameters were normal. Normal left ventricular filling pressure.  Right Ventricle: The right ventricular size is normal. No increase in right ventricular wall thickness. Right ventricular systolic function is normal.  Left Atrium: Left atrial size was normal in size.  Right Atrium: Right atrial size was normal in size.  Pericardium: There is no evidence of pericardial effusion.  Mitral Valve: The mitral valve is normal in structure. Trivial mitral valve regurgitation. No evidence of mitral valve stenosis.  Tricuspid Valve: The tricuspid valve is normal in structure. Tricuspid valve regurgitation is not demonstrated. No evidence of tricuspid stenosis.  Aortic Valve: The aortic valve is normal in structure. Aortic valve regurgitation is not visualized. No aortic stenosis is present.  Pulmonic Valve: The pulmonic valve was normal in structure. Pulmonic valve regurgitation is not visualized. No evidence of pulmonic stenosis.  Aorta: The aortic root is normal in size and structure.  Venous: The inferior vena cava is normal in size with greater than 50% respiratory variability, suggesting right atrial pressure of 3 mmHg.  IAS/Shunts: No atrial level shunt detected by color flow Doppler.   LEFT VENTRICLE PLAX 2D LVIDd:         4.80  cm         Diastology LVIDs:         2.80 cm         LV e' medial:    9.25 cm/s LV PW:         0.70 cm         LV E/e' medial:  10.8 LV IVS:        1.00 cm  LV e' lateral:   18.60 cm/s LVOT diam:     2.50 cm         LV E/e' lateral: 5.4 LV SV:         103 LV SV Index:   43              2D LVOT Area:     4.91 cm        Longitudinal Strain 2D Strain GLS  -20.4 % (A2C): 2D Strain GLS  -19.6 % (A3C): 2D Strain GLS  -21.3 % (A4C): 2D Strain GLS  -20.4 % Avg:  3D Volume EF LV 3D EF:    Left ventricul ar ejection fraction by 3D volume is 67 %.  3D Volume EF: 3D EF:        67 % LV EDV:       156 ml LV ESV:       52 ml LV SV:        104 ml  RIGHT VENTRICLE RV Basal diam:  3.90 cm RV S prime:     14.00 cm/s TAPSE (M-mode): 2.0 cm  LEFT ATRIUM             Index        RIGHT ATRIUM           Index LA diam:        3.60 cm 1.49 cm/m   RA Area:     20.40 cm LA Vol (A2C):   64.7 ml 26.77 ml/m  RA Volume:   64.00 ml  26.48 ml/m LA Vol (A4C):   65.7 ml 27.18 ml/m LA Biplane Vol: 70.8 ml 29.29 ml/m AORTIC VALVE LVOT Vmax:   110.50 cm/s LVOT Vmean:  71.700 cm/s LVOT VTI:    0.210 m  AORTA Ao Root diam: 3.00 cm Ao Asc diam:  3.00 cm  MITRAL VALVE MV Area (PHT): cm          SHUNTS MV Decel Time: 197 msec     Systemic VTI:  0.21 m MV E velocity: 100.25 cm/s  Systemic Diam: 2.50 cm MV A velocity: 59.10 cm/s MV E/A ratio:  1.70  Mihai Croitoru MD Electronically signed by Jerel Balding MD Signature Date/Time: 10/04/2022/12:12:10 PM    Final    MONITORS  LONG TERM MONITOR (3-14 DAYS) 01/05/2021  Narrative Sinus rhythm PVCs remain resolved post ablation  Patient had a min HR of 39 bpm, max HR of 167 bpm, and avg HR of 79 bpm.  Bradycardia events appear to occur nocturnally   CT SCANS  CT CORONARY MORPH W/CTA COR W/SCORE 11/28/2019  Addendum 11/28/2019  5:05 PM ADDENDUM REPORT: 11/28/2019 17:02  HISTORY: Chest pain, cardiac cause suspected, CHEST  PAIN OR PRESSURE  EXAM: Cardiac/Coronary CT  TECHNIQUE: The patient was scanned on a [employer].  PROTOCOL: A 120 kV prospective scan was triggered in the descending thoracic aorta at 111 HU's. Axial non-contrast 3 mm slices were carried out through the heart. The data set was analyzed on a dedicated work station and scored using the Agatson method. Gantry rotation speed was 250 msecs and collimation was 0.6 mm. Beta blockade and 0.8 mg of sl NTG was given. The 3D data set was reconstructed in 5% intervals of 35-75% of the R-R cycle. Diastolic phases were analyzed on a dedicated work station using MPR, MIP and VRT modes. The patient received 100mL OMNIPAQUE  IOHEXOL  350 MG/ML SOLN of contrast.  FINDINGS: Coronary calcium  score: The patient's coronary artery calcium   score is 0, which places the patient in the 0 percentile.  Coronary arteries: Normal coronary origins.  Left dominance.  Right Coronary Artery: Small caliber vessel. No significant plaque or stenosis. Short, nondominant vessel.  Left Main Coronary Artery: Normal caliber vessel. No significant plaque or stenosis.  Left Anterior Descending Coronary Artery: Normal caliber vessel. No significant plaque or stenosis. Gives rise to 1 large diagonal branch with two sub-branches. Distal LAD wraps apex.  Left Circumflex Artery: Normal caliber vessel, gives rise to PDA. No significant plaque or stenosis. Gives rise to 1 large OM branch.  Aorta: Normal size, 31 mm at the mid ascending aorta (level of the PA bifurcation) measured double oblique. No calcifications. No dissection.  Aortic Valve: No calcifications. Trileaflet.  Other findings:  Normal pulmonary vein drainage into the left atrium.  Normal left atrial appendage without a thrombus.  Normal size of the pulmonary artery.  IMPRESSION: 1. No evidence of CAD, CADRADS = 0.  2. Coronary calcium  score of 0. This was 0 percentile for age and sex  matched control.  3. Normal coronary origin with left dominance.   Electronically Signed By: Shelda Bruckner M.D. On: 11/28/2019 17:02  Narrative EXAM: OVER-READ INTERPRETATION  CT CHEST  The following report is an over-read performed by radiologist Dr. Marcey Moan of [facility] on 11/28/2019. This over-read does not include interpretation of cardiac or coronary anatomy or pathology. The coronary CTA interpretation by the cardiologist is attached.  COMPARISON:  None.  FINDINGS: Vascular: No incidental vascular findings.  Mediastinum/Nodes: Visualized mediastinum and hilar regions demonstrate no lymphadenopathy or focal masses.  Lungs/Pleura: Visualized lungs show no evidence of pulmonary edema, consolidation, pneumothorax, nodule or pleural fluid.  Upper Abdomen: The visualized upper abdomen demonstrates diffuse hepatic steatosis.  Musculoskeletal: No bony abnormalities identified.  IMPRESSION: No significant noncardiac findings in the visualized chest. Diffuse hepatic steatosis.  Electronically Signed: By: Marcey Moan M.D. On: 11/28/2019 11:05   CARDIAC MRI  MR CARDIAC MORPHOLOGY W WO CONTRAST 08/04/2011  Narrative *RADIOLOGY REPORT*  Clinical Data: PVCs, dilated cardiomyopathy.  MR CARDIA MORPHOLOGY WITHOUT AND WITH CONTRAST  GE 1.5 T magnet with dedicated cardiac coil.  FIESTA sequences for function and morphology.  T1 and T1 fat sat sequences for tissue characterization.  10 minutes after 25 mL Multihance  contrast was given, inversion recovery sequences were done to assess for delayed enhancement.  Unable to calculate EF at workstation due to frequent PVCs.  Contrast: 25mL MULTIHANCE  GADOBENATE DIMEGLUMINE  529 MG/ML IV SOLN  Comparison: None.  Findings: Difficult study due to gating artifact from frequent PVCs.  Normal left ventricular size and wall thickness.  Unable to calculated EF volumetrically due to frequent  PVCs.  Visually, EF appeared mild to moderate depressed, would estimate 40%.  Global hypokinesis.  The right ventricle was normal in size with mildly reduced global systolic function.  No regional wall motion abnormalities in the RV or LV.  No aneurysmal segments in the RV wall.  The atria appeared normal in size.  Difficult to comment on valvular regurgitation due to artifact.  On delayed enhancement images, there was no myocardial delayed enhancement.  IMPRESSION: 1.  Difficult study due to frequent PVCs.  2.  Normal LV size with mild to moderate global systolic dysfunction, no regionality.  EF estimated at 40%.  Unable to calculate EF due frequent PVCs.  3. Normal RV size with mild systolic dysfunction.  No regional RV wall motion abnormalities or aneurysmal segments.  4.  No myocardial  delayed enhancement, so no evidence for prior MI, infiltrative disease, or myocarditis.  5.  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  This study is not suggestive of ARVD.  Original Report Authenticated By: [id number]   ______________________________________________________________________________________________       Recent Labs: No results found for requested labs within last 365 days.  Recent Lipid Panel    Component Value Date/Time   CHOL 202 (H) 08/21/2022 0801   TRIG 239 (H) 08/21/2022 0801   HDL 34 (L) 08/21/2022 0801   CHOLHDL 5.9 (H) 08/21/2022 0801   LDLCALC 125 (H) 08/21/2022 0801   LDLDIRECT 124 (H) 04/27/2022 9078       Physical Exam:    VS:  BP 130/80   Pulse 73   Ht 6' 1 (1.854 m)   Wt 274 lb (124.3 kg)   SpO2 97%   BMI 36.15 kg/m     Wt Readings from Last 3 Encounters:  08/15/24 274 lb (124.3 kg)  05/31/23 261 lb (118.4 kg)  09/13/22 263 lb (119.3 kg)   Gen: no distress, morbid obesity   Neck: No JVD Ears: Early Frank Sign Cardiac: No Rubs or Gallops, no Murmur, RRR +2 radial  pulses Respiratory: Clear to auscultation bilaterally, normal effort, normal  respiratory rate GI: Soft, nontender, non-distended  MS: No  edema;  moves all extremities Integument: Skin feels warm Neuro:  At time of evaluation, alert and oriented to person/place/time/situation  Psych: Normal affect, patient feels ok   ASSESSMENT/PLAN:    HF-Recovered EF (2023) - NYHA I, Euvolemic, Stage A  - resolved with ablations  PVCs with hx of PVC ablation (2014 at [facility]) - EKG shows sinus rhythm with sinus arrhythmia, no PVCs. No current symptoms of heart failure or PVCs. Previous ablations and heart monitor not completed. No new symptoms warranting further investigation at this time. - Monitor for new symptoms of PVCs or heart failure. - Will consider repeating heart monitor if new symptoms arise.  Morbid obesity with severe obstructive sleep apnea HTN - Severe obstructive sleep apnea with an AHI of 59. Difficulty tolerating CPAP due to sinus issues and deviated septum. Episodes of night terrors and heart racing potentially related to sleep apnea. ENT evaluation ongoing with treatment for sinus issues. Discussed potential use of Zepbound for weight reduction and improvement of sleep apnea. Zepbound may aid in weight loss and improve sleep apnea, but requires consideration of side effects such as slowed digestion, reflux, and dietary adjustments. Insurance prior authorization for Zepbound is being submitted. - Continue ENT treatment for sinus issues to improve CPAP tolerance. - Submitted prior authorization for Zepbound  - Will consider Zepbound if CPAP remains intolerable and weight reduction is needed. - Encouraged lifestyle modifications including diet and exercise. [facility] Gower D visit in February  Hyperlipidemia Managed with atorvastatin  20 mg. No recent lipid panel available. Discussed importance of monitoring cholesterol levels. - Ordered fasting lipid panel to assess current cholesterol  levels.  Longitudinal care: The evaluation and management services provided today reflect the complexity inherent in caring for this patient, including the ongoing longitudinal relationship and management of multiple chronic conditions and/or the need for care coordination. The visit required a comprehensive assessment and management plan tailored to the patient's unique needs Time was spent addressing not only the acute concerns but also the broader context of the patient's health, including preventive care, chronic disease management, and care coordination as appropriate.  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 [place], [place] [address] [phone]  12:10 PM

## 2024-08-16 LAB — ALT: ALT: 79 IU/L — ABNORMAL HIGH (ref 0–44)

## 2024-08-16 LAB — LDL CHOLESTEROL, DIRECT: LDL Direct: 121 mg/dL — AB (ref 0–99)

## 2024-08-18 NOTE — Telephone Encounter (Signed)
 Pharmacy Patient Advocate Encounter  Received notification from [facility] that Prior Authorization for [facility] has been DENIED.  Full denial letter will be uploaded to the media tab. See denial reason below. PLAN EXCLUSION

## 2024-08-18 NOTE — Telephone Encounter (Signed)
 Drug is excluded from plan regardless of indication of use.

## 2024-08-27 ENCOUNTER — Ambulatory Visit: Payer: Self-pay | Admitting: *Deleted

## 2024-08-29 NOTE — Telephone Encounter (Signed)
 Patient is returning call.

## 2024-09-17 DIAGNOSIS — G4733 Obstructive sleep apnea (adult) (pediatric): Secondary | ICD-10-CM | POA: Diagnosis not present

## 2024-10-06 ENCOUNTER — Other Ambulatory Visit: Payer: Self-pay | Admitting: Internal Medicine

## 2024-10-07 ENCOUNTER — Telehealth: Payer: Self-pay | Admitting: Internal Medicine

## 2024-10-07 MED ORDER — ATORVASTATIN CALCIUM 20 MG PO TABS: 20.0000 mg | ORAL_TABLET | Freq: Every day | ORAL | 3 refills | Status: AC

## 2024-10-07 NOTE — Telephone Encounter (Signed)
" °*  STAT* If patient is at the pharmacy, call can be transferred to refill team.   1. Which medications need to be refilled? (please list name of each medication and dose if known)  atorvastatin  (LIPITOR) 20 MG tablet   2. Would you like to learn more about the convenience, safety, & potential cost savings by using the [facility]? no   3. Are you open to using the [facility] (Type [facility]. no   4. Which pharmacy/location (including street and city if local pharmacy) is medication to be sent to?  [facility] - [place], [place] - [address]     5. Do they need a 30 day or 90 day supply? 90 day  "

## 2024-10-07 NOTE — Telephone Encounter (Signed)
 Refill sent
